# Patient Record
Sex: Male | Born: 1955 | Race: Black or African American | Hispanic: No | Marital: Married | Smoking: Never smoker
Health system: Southern US, Community
[De-identification: ages and names within clinical notes are randomized; demographics above are authoritative.]

## PROBLEM LIST (undated history)

## (undated) DIAGNOSIS — N186 End stage renal disease: Secondary | ICD-10-CM

## (undated) DIAGNOSIS — D649 Anemia, unspecified: Secondary | ICD-10-CM

## (undated) DIAGNOSIS — T8859XA Other complications of anesthesia, initial encounter: Secondary | ICD-10-CM

## (undated) DIAGNOSIS — E119 Type 2 diabetes mellitus without complications: Secondary | ICD-10-CM

## (undated) DIAGNOSIS — N189 Chronic kidney disease, unspecified: Secondary | ICD-10-CM

## (undated) DIAGNOSIS — I10 Essential (primary) hypertension: Secondary | ICD-10-CM

## (undated) DIAGNOSIS — M1990 Unspecified osteoarthritis, unspecified site: Secondary | ICD-10-CM

## (undated) DIAGNOSIS — G4730 Sleep apnea, unspecified: Secondary | ICD-10-CM

## (undated) HISTORY — PX: ROTATOR CUFF REPAIR: SHX139

## (undated) HISTORY — PX: HERNIA REPAIR: SHX51

---

## 2020-10-18 ENCOUNTER — Other Ambulatory Visit: Payer: Self-pay

## 2020-10-18 ENCOUNTER — Ambulatory Visit (HOSPITAL_COMMUNITY)
Admission: EM | Admit: 2020-10-18 | Discharge: 2020-10-18 | Disposition: A | Discharge status: Home / Self Care | Payer: No Typology Code available for payment source | Attending: Physician Assistant | Admitting: Physician Assistant

## 2020-10-18 ENCOUNTER — Encounter (HOSPITAL_COMMUNITY): Payer: Self-pay | Admitting: Emergency Medicine

## 2020-10-18 DIAGNOSIS — J329 Chronic sinusitis, unspecified: Secondary | ICD-10-CM | POA: Insufficient documentation

## 2020-10-18 DIAGNOSIS — J9801 Acute bronchospasm: Secondary | ICD-10-CM | POA: Insufficient documentation

## 2020-10-18 DIAGNOSIS — J40 Bronchitis, not specified as acute or chronic: Secondary | ICD-10-CM | POA: Insufficient documentation

## 2020-10-18 HISTORY — DX: Type 2 diabetes mellitus without complications: E11.9

## 2020-10-18 HISTORY — DX: Essential (primary) hypertension: I10

## 2020-10-18 LAB — CBC WITH DIFFERENTIAL/PLATELET
Abs Immature Granulocytes: 0.1 10*3/uL — ABNORMAL HIGH (ref 0.00–0.07)
Basophils Absolute: 0 10*3/uL (ref 0.0–0.1)
Basophils Relative: 1 %
Eosinophils Absolute: 0.4 10*3/uL (ref 0.0–0.5)
Eosinophils Relative: 5 %
HCT: 32.2 % — ABNORMAL LOW (ref 39.0–52.0)
Hemoglobin: 10.1 g/dL — ABNORMAL LOW (ref 13.0–17.0)
Immature Granulocytes: 2 %
Lymphocytes Relative: 15 %
Lymphs Abs: 1 10*3/uL (ref 0.7–4.0)
MCH: 29.1 pg (ref 26.0–34.0)
MCHC: 31.4 g/dL (ref 30.0–36.0)
MCV: 92.8 fL (ref 80.0–100.0)
Monocytes Absolute: 0.8 10*3/uL (ref 0.1–1.0)
Monocytes Relative: 11 %
Neutro Abs: 4.5 10*3/uL (ref 1.7–7.7)
Neutrophils Relative %: 66 %
Platelets: 244 10*3/uL (ref 150–400)
RBC: 3.47 MIL/uL — ABNORMAL LOW (ref 4.22–5.81)
RDW: 14.8 % (ref 11.5–15.5)
WBC: 6.8 10*3/uL (ref 4.0–10.5)
nRBC: 0 % (ref 0.0–0.2)

## 2020-10-18 LAB — CBG MONITORING, ED: Glucose-Capillary: 384 mg/dL — ABNORMAL HIGH (ref 70–99)

## 2020-10-18 LAB — COMPREHENSIVE METABOLIC PANEL
ALT: 11 U/L (ref 0–44)
AST: 12 U/L — ABNORMAL LOW (ref 15–41)
Albumin: 2.9 g/dL — ABNORMAL LOW (ref 3.5–5.0)
Alkaline Phosphatase: 78 U/L (ref 38–126)
Anion gap: 6 (ref 5–15)
BUN: 48 mg/dL — ABNORMAL HIGH (ref 8–23)
CO2: 21 mmol/L — ABNORMAL LOW (ref 22–32)
Calcium: 8.3 mg/dL — ABNORMAL LOW (ref 8.9–10.3)
Chloride: 107 mmol/L (ref 98–111)
Creatinine, Ser: 7.31 mg/dL — ABNORMAL HIGH (ref 0.61–1.24)
GFR, Estimated: 8 mL/min — ABNORMAL LOW (ref >60)
Glucose, Bld: 449 mg/dL — ABNORMAL HIGH (ref 70–99)
Potassium: 5.3 mmol/L — ABNORMAL HIGH (ref 3.5–5.1)
Sodium: 134 mmol/L — ABNORMAL LOW (ref 135–145)
Total Bilirubin: 0.5 mg/dL (ref 0.3–1.2)
Total Protein: 7 g/dL (ref 6.5–8.1)

## 2020-10-18 LAB — BRAIN NATRIURETIC PEPTIDE: B Natriuretic Peptide: 94.8 pg/mL (ref 0.0–100.0)

## 2020-10-18 MED ORDER — BUDESONIDE-FORMOTEROL FUMARATE 160-4.5 MCG/ACT IN AERO: 2.0000 | INHALATION_SPRAY | Freq: Two times a day (BID) | RESPIRATORY_TRACT | 0 refills | Status: DC

## 2020-10-18 MED ORDER — ALBUTEROL SULFATE HFA 108 (90 BASE) MCG/ACT IN AERS
2.0000 | INHALATION_SPRAY | Freq: Once | RESPIRATORY_TRACT | Status: AC
  Administered 2020-10-18: 2 via RESPIRATORY_TRACT

## 2020-10-18 MED ORDER — ALBUTEROL SULFATE HFA 108 (90 BASE) MCG/ACT IN AERS
INHALATION_SPRAY | RESPIRATORY_TRACT | Status: AC
  Filled 2020-10-18: qty 6.7

## 2020-10-18 MED ORDER — DOXYCYCLINE HYCLATE 100 MG PO CAPS: 100.0000 mg | ORAL_CAPSULE | Freq: Two times a day (BID) | ORAL | 0 refills | Status: DC

## 2020-10-18 MED ORDER — ALBUTEROL SULFATE HFA 108 (90 BASE) MCG/ACT IN AERS: 1.0000 | INHALATION_SPRAY | Freq: Four times a day (QID) | RESPIRATORY_TRACT | 0 refills | Status: DC | PRN

## 2020-10-18 MED ORDER — PREDNISONE 10 MG PO TABS: 20.0000 mg | ORAL_TABLET | Freq: Every day | ORAL | 0 refills | Status: DC

## 2020-10-18 NOTE — ED Provider Notes (Signed)
[facility]    CSN: [id number] Arrival date & time: 10/18/20  1855      History   Chief Complaint Chief Complaint  [name] presents with  . Cough  . Nasal Congestion    HPI Marcus [name] is a 65 y.o. male.   [name] presents today with a 1 week history of cough.  Reports associated shortness of breath, wheezing, nasal congestion.  He denies any fever, chest pain, nausea, vomiting.  He has tried over-the-counter cough medication without improvement of symptoms.  He denies any known sick contacts.  He denies any recent antibiotic use.  He denies history of asthma, COPD, smoking.  He denies history of cardiac disease including heart failure.  He does report difficulty laying flat but attributes this to nasal congestion.  Denies any significant leg swelling or sudden weight gain.  He has had COVID-19 vaccinations including boosters.  Has not had influenza vaccination.  He is having difficulty with daily activities as a result of symptoms.  He has a history of diabetes and so is unable to take large amounts of prednisone.  Reports blood sugars are generally controlled with oral agents.     Past Medical History:  Diagnosis Date  . Diabetes mellitus without complication ([place])   . Hypertension     There are no problems to display for this [name].   Past Surgical History:  Procedure Laterality Date  . HERNIA REPAIR    . ROTATOR CUFF REPAIR Right        Home Medications    Prior to Admission medications   Medication Sig Start Date End Date Taking? Authorizing Provider  amLODipine (NORVASC) 10 MG tablet Take by mouth. 03/22/20  Yes [provider]  atorvastatin (LIPITOR) 20 MG tablet Take 0.5 tablets by mouth at bedtime. 06/29/20  Yes [provider]  carvedilol (COREG) 25 MG tablet Take by mouth. 03/22/20  Yes [provider]  losartan (COZAAR) 100 MG tablet Take 1 tablet by mouth daily. 07/16/20  Yes [provider]  albuterol  (VENTOLIN HFA) 108 (90 Base) MCG/ACT inhaler Inhale 1-2 puffs into the lungs every 6 (six) hours as needed for wheezing or shortness of breath. 10/18/20   Marcus [name], Marcus Skill, PA-C  B Complex Vitamins (B COMPLEX-B12) TABS Take 1 tablet by mouth daily.    [provider]  budesonide-formoterol (SYMBICORT) 160-4.5 MCG/ACT inhaler Inhale 2 puffs into the lungs in the morning and at bedtime. 10/18/20   Marcus [name], Marcus Skill, PA-C  doxycycline (VIBRAMYCIN) 100 MG capsule Take 1 capsule (100 mg total) by mouth 2 (two) times daily. 10/18/20   Marcus Gauthier K, PA-C  glipiZIDE (GLUCOTROL) 5 MG tablet Take by mouth.    [provider]  hydrochlorothiazide (HYDRODIURIL) 12.5 MG tablet Take by mouth.    [provider]  potassium chloride (KLOR-CON) 10 MEQ tablet Take by mouth.    [provider]    Family History History reviewed. No pertinent family history.  Social History Social History   Tobacco Use  . Smoking status: Never Smoker  . Smokeless tobacco: Never Used  Vaping Use  . Vaping Use: Never used  Substance Use Topics  . Alcohol use: Never  . Drug use: Never     Allergies   [name] has no known allergies.   Review of Systems Review of Systems  Constitutional: Positive for activity change and appetite change. Negative for fatigue, fever and unexpected weight change.  HENT: Positive for sinus pressure. Negative for congestion, sneezing  and sore throat.   Respiratory: Positive for cough, chest tightness, shortness of breath and wheezing.   Cardiovascular: Negative for chest pain.  Gastrointestinal: Negative for abdominal pain, diarrhea, nausea and vomiting.  Musculoskeletal: Negative for arthralgias and myalgias.  Neurological: Positive for dizziness. Negative for headaches.     Physical Exam Triage Vital Signs ED Triage Vitals  Enc Vitals Group     BP 10/18/20 2020 113/73     Pulse Rate 10/18/20 2020 67     Resp 10/18/20 2020 (!) 25     Temp 10/18/20  2020 98.1 F (36.7 C)     Temp Source 10/18/20 2020 Oral     SpO2 10/18/20 2020 98 %     Weight --      Height --      Head Circumference --      Peak Flow --      Pain Score 10/18/20 2015 0     Pain Loc --      Pain Edu? --      Excl. in [place]? --    No data found.  Updated Vital Signs BP 113/73 (BP Location: Left Arm) Comment (BP Location): large cuff  Pulse 67   Temp 98.1 F (36.7 C) (Oral)   Resp (!) 25   SpO2 98%   Visual Acuity Right Eye Distance:   Left Eye Distance:   Bilateral Distance:    Right Eye Near:   Left Eye Near:    Bilateral Near:     Physical Exam Vitals reviewed.  Constitutional:      General: He is awake.     Appearance: Normal appearance. He is normal weight. He is not ill-appearing.     Comments: Very pleasant male appears stated age in no acute distress  HENT:     Head: Normocephalic and atraumatic.     Right Ear: Tympanic membrane, ear canal and external ear normal. Tympanic membrane is not erythematous or bulging.     Left Ear: Tympanic membrane, ear canal and external ear normal. Tympanic membrane is not erythematous or bulging.     Nose: Nose normal.     Right Sinus: No maxillary sinus tenderness or frontal sinus tenderness.     Left Sinus: No maxillary sinus tenderness or frontal sinus tenderness.     Mouth/Throat:     Pharynx: Uvula midline. Posterior oropharyngeal erythema present. No oropharyngeal exudate.  Cardiovascular:     Rate and Rhythm: Normal rate and regular rhythm.     Heart sounds: No murmur heard.   Pulmonary:     Effort: Pulmonary effort is normal. No accessory muscle usage or respiratory distress.     Breath sounds: No stridor. Wheezing present. No rhonchi or rales.     Comments: Widespread wheezing throughout lung fields.  Reactive cough with deep breathing. Abdominal:     General: Bowel sounds are normal.     Palpations: Abdomen is soft.     Tenderness: There is no abdominal tenderness.     Comments: Benign  abdominal exam  Musculoskeletal:     Right lower leg: No edema.     Left lower leg: No edema.  Lymphadenopathy:     Head:     Right side of head: No submental, submandibular or tonsillar adenopathy.     Left side of head: No submental, submandibular or tonsillar adenopathy.     Cervical: No cervical adenopathy.  Neurological:     Mental Status: He is alert.  Psychiatric:  Behavior: Behavior is cooperative.      UC Treatments / Results  Labs (all labs ordered are listed, but only abnormal results are displayed) Labs Reviewed  CBG MONITORING, ED - Abnormal; Notable for the following components:      Result Value   Glucose-Capillary 384 (*)    All other components within normal limits  CBC WITH DIFFERENTIAL/PLATELET  COMPREHENSIVE METABOLIC PANEL  BRAIN NATRIURETIC PEPTIDE    EKG   Radiology No results found.  Procedures Procedures (including critical care time)  Medications Ordered in UC Medications  albuterol (VENTOLIN HFA) 108 (90 Base) MCG/ACT inhaler 2 puff (2 puffs Inhalation Given 10/18/20 2040)    Initial Impression / Assessment and Plan / UC Course  I have reviewed the triage vital signs and the nursing notes.  Pertinent labs & imaging results that were available during my care of the [name] were reviewed by me and considered in my medical decision making (see chart for details).     Suspect symptoms are related to significant bronchospasm.  [name] given albuterol inhaler in office with minimal improvement of symptoms.  He was prescribed albuterol to be used at home.  Will prescribe Symbicort to help manage symptoms.  He is diabetic but random blood sugar was 384 so we will defer steroids at this time.  [name] was instructed to avoid carbohydrates and push fluids.  Discussed that if blood sugar is elevated he needs to contact PCP.  CBC, BNP, CMP obtained today-results pending.  Low suspicion for CHF given no edema on exam lungs are clear but will  obtain BNP to rule out CHF exacerbation; if elevated [name] will need to go to the ER.  If [name] has significant leukocytosis on CBC he will also need to go to the emergency room.  [name] was instructed to follow-up with either our clinic or PCP within 24 to 48 hours to ensure improvement of symptoms.  Discussed at length alarm symptoms that warrant emergent evaluation.  Strict return precautions given to which [name] expressed understanding.  Final Clinical Impressions(s) / UC Diagnoses   Final diagnoses:  Sinobronchitis  Bronchospasm     Discharge Instructions     Take albuterol every 4-6 hours as needed for shortness of breath.  Take Symbicort twice daily to help open up your lungs.  You should rinse your mouth after using this medication to avoid thrush. Take doxycycline (antibiotic) twice daily for 10 days.  This will make you more sensitive to the sun so please do not be in the sun while on this medicine.  If you have any worsening symptoms including persistent shortness of breath you need to be reevaluated in the emergency room.  Someone to listen to you within 24 to 48 hours to ensure improvement which can be done either with your PCP or our clinic.    ED Prescriptions    Medication Sig Dispense Auth. Provider   albuterol (VENTOLIN HFA) 108 (90 Base) MCG/ACT inhaler  (Status: Discontinued) Inhale 1-2 puffs into the lungs every 6 (six) hours as needed for wheezing or shortness of breath. 8 g Vallery Mcdade K, PA-C   budesonide-formoterol (SYMBICORT) 160-4.5 MCG/ACT inhaler  (Status: Discontinued) Inhale 2 puffs into the lungs in the morning and at bedtime. 10.2 g Marcus [name] K, PA-C   predniSONE (DELTASONE) 10 MG tablet  (Status: Discontinued) Take 2 tablets (20 mg total) by mouth daily for 4 days. 8 tablet Marcus [name] K, PA-C   doxycycline (VIBRAMYCIN) 100 MG capsule  (Status: Discontinued)  Take 1 capsule (100 mg total) by mouth 2 (two) times daily. 20 capsule Marcus [name] K, PA-C    albuterol (VENTOLIN HFA) 108 (90 Base) MCG/ACT inhaler Inhale 1-2 puffs into the lungs every 6 (six) hours as needed for wheezing or shortness of breath. 8 g Marcus [name] K, PA-C   budesonide-formoterol (SYMBICORT) 160-4.5 MCG/ACT inhaler Inhale 2 puffs into the lungs in the morning and at bedtime. 10.2 g Marcus [name] K, PA-C   doxycycline (VIBRAMYCIN) 100 MG capsule Take 1 capsule (100 mg total) by mouth 2 (two) times daily. 20 capsule Hoyt Leanos, Marcus Skill, PA-C     PDMP not reviewed this encounter.   Terrilee Croak, PA-C 10/18/20 2107

## 2020-10-18 NOTE — ED Triage Notes (Addendum)
Cough and congestion for a week, patient complains of wheezing as well  Unable to lie down and breathe.  After lying down, will sit up for about 5 minutes and then be able to breathe

## 2020-10-18 NOTE — Discharge Instructions (Addendum)
Take albuterol every 4-6 hours as needed for shortness of breath.  Take Symbicort twice daily to help open up your lungs.  You should rinse your mouth after using this medication to avoid thrush. Take doxycycline (antibiotic) twice daily for 10 days.  This will make you more sensitive to the sun so please do not be in the sun while on this medicine.  If you have any worsening symptoms including persistent shortness of breath you need to be reevaluated in the emergency room.  Someone to listen to you within 24 to 48 hours to ensure improvement which can be done either with your PCP or our clinic.

## 2021-01-10 ENCOUNTER — Emergency Department (HOSPITAL_COMMUNITY)
Admission: EM | Admit: 2021-01-10 | Discharge: 2021-01-10 | Disposition: A | Discharge status: Home / Self Care | Payer: No Typology Code available for payment source | Attending: Emergency Medicine | Admitting: Emergency Medicine

## 2021-01-10 ENCOUNTER — Other Ambulatory Visit: Payer: Self-pay

## 2021-01-10 ENCOUNTER — Encounter (HOSPITAL_COMMUNITY): Payer: Self-pay | Admitting: *Deleted

## 2021-01-10 DIAGNOSIS — E1165 Type 2 diabetes mellitus with hyperglycemia: Secondary | ICD-10-CM | POA: Diagnosis not present

## 2021-01-10 DIAGNOSIS — R739 Hyperglycemia, unspecified: Secondary | ICD-10-CM

## 2021-01-10 DIAGNOSIS — Z5329 Procedure and treatment not carried out because of patient's decision for other reasons: Secondary | ICD-10-CM

## 2021-01-10 DIAGNOSIS — I10 Essential (primary) hypertension: Secondary | ICD-10-CM | POA: Insufficient documentation

## 2021-01-10 DIAGNOSIS — Z79899 Other long term (current) drug therapy: Secondary | ICD-10-CM | POA: Diagnosis not present

## 2021-01-10 DIAGNOSIS — Z7984 Long term (current) use of oral hypoglycemic drugs: Secondary | ICD-10-CM | POA: Diagnosis not present

## 2021-01-10 DIAGNOSIS — Z7951 Long term (current) use of inhaled steroids: Secondary | ICD-10-CM | POA: Diagnosis not present

## 2021-01-10 DIAGNOSIS — N289 Disorder of kidney and ureter, unspecified: Secondary | ICD-10-CM

## 2021-01-10 LAB — CBG MONITORING, ED
Glucose-Capillary: 511 mg/dL (ref 70–99)
Glucose-Capillary: 498 mg/dL — ABNORMAL HIGH (ref 70–99)
Glucose-Capillary: 446 mg/dL — ABNORMAL HIGH (ref 70–99)

## 2021-01-10 LAB — URINALYSIS, ROUTINE W REFLEX MICROSCOPIC
Bacteria, UA: NONE SEEN
Bilirubin Urine: NEGATIVE
Glucose, UA: >=500 mg/dL — AB
Ketones, ur: NEGATIVE mg/dL
Leukocytes,Ua: NEGATIVE
Nitrite: NEGATIVE
Protein, ur: >=300 mg/dL — AB
Specific Gravity, Urine: 1.012 (ref 1.005–1.030)
pH: 5 (ref 5.0–8.0)

## 2021-01-10 LAB — CBC
HCT: 30.9 % — ABNORMAL LOW (ref 39.0–52.0)
Hemoglobin: 10.2 g/dL — ABNORMAL LOW (ref 13.0–17.0)
MCH: 29.3 pg (ref 26.0–34.0)
MCHC: 33 g/dL (ref 30.0–36.0)
MCV: 88.8 fL (ref 80.0–100.0)
Platelets: 239 10*3/uL (ref 150–400)
RBC: 3.48 MIL/uL — ABNORMAL LOW (ref 4.22–5.81)
RDW: 15.3 % (ref 11.5–15.5)
WBC: 6 10*3/uL (ref 4.0–10.5)
nRBC: 0 % (ref 0.0–0.2)

## 2021-01-10 LAB — BASIC METABOLIC PANEL
Anion gap: 9 (ref 5–15)
BUN: 98 mg/dL — ABNORMAL HIGH (ref 8–23)
CO2: 21 mmol/L — ABNORMAL LOW (ref 22–32)
Calcium: 8.9 mg/dL (ref 8.9–10.3)
Chloride: 101 mmol/L (ref 98–111)
Creatinine, Ser: 8.76 mg/dL — ABNORMAL HIGH (ref 0.61–1.24)
GFR, Estimated: 6 mL/min — ABNORMAL LOW (ref >60)
Glucose, Bld: 630 mg/dL (ref 70–99)
Potassium: 4.9 mmol/L (ref 3.5–5.1)
Sodium: 131 mmol/L — ABNORMAL LOW (ref 135–145)

## 2021-01-10 LAB — BLOOD GAS, VENOUS
Acid-base deficit: 6.3 mmol/L — ABNORMAL HIGH (ref 0.0–2.0)
Bicarbonate: 20.3 mmol/L (ref 20.0–28.0)
O2 Saturation: 72.6 %
Patient temperature: 37
pCO2, Ven: 48.1 mmHg (ref 44.0–60.0)
pH, Ven: 7.249 — ABNORMAL LOW (ref 7.250–7.430)
pO2, Ven: 45.8 mmHg — ABNORMAL HIGH (ref 32.0–45.0)

## 2021-01-10 MED ORDER — INSULIN ASPART 100 UNIT/ML IV SOLN
10.0000 [IU] | Freq: Once | INTRAVENOUS | Status: AC
  Administered 2021-01-10: 10 [IU] via INTRAVENOUS
  Filled 2021-01-10: qty 0.1

## 2021-01-10 MED ORDER — LACTATED RINGERS IV BOLUS
1000.0000 mL | Freq: Once | INTRAVENOUS | Status: AC
  Administered 2021-01-10: 1000 mL via INTRAVENOUS

## 2021-01-10 NOTE — ED Triage Notes (Signed)
Pt went to PCP for regular checkup. CBG 609. Sent here for treatment.

## 2021-01-10 NOTE — Discharge Instructions (Addendum)
Please continue to monitor your symptoms closely.  I would recommend coming back to the emergency department for nephrology evaluation as well as admission.  Please make sure you also follow-up with your regular doctor regarding your kidney function today as well as your high blood sugar.

## 2021-01-10 NOTE — ED Notes (Signed)
Pt was able to walk with a steady gait to restroom and back to room.

## 2021-01-10 NOTE — ED Provider Notes (Signed)
[facility] DEPT Provider Note   CSN: [id number] Arrival date & time: 01/10/21  1452     History Chief Complaint  Patient presents with   Hyperglycemia    Marcus [name] is a 65 y.o. male.  HPI Patient is a 65 year old male with a history of hypertension as well as diabetes mellitus on glipizide twice daily who states that he recently moved to the area from [place].  He was establishing care with a new PCP at his local [place].  After obtaining labs and going home they called him and told him that his CBG was elevated at 609 and told him to come to the emergency department for treatment.  Patient states that for the past few days she has been increasingly fatigued and reports polyuria as well as polydipsia.  Also notes some intermittent blurry vision.  No abdominal pain, nausea, vomiting, or diarrhea.  His wife states that he has been taking his glipizide but recently has also been drinking more soda than normal and eating potatoes and cheese.    Past Medical History:  Diagnosis Date   Diabetes mellitus without complication ([place])    Hypertension     There are no problems to display for this patient.   Past Surgical History:  Procedure Laterality Date   HERNIA REPAIR     ROTATOR CUFF REPAIR Right        No family history on file.  Social History   Tobacco Use   Smoking status: Never   Smokeless tobacco: Never  Vaping Use   Vaping Use: Never used  Substance Use Topics   Alcohol use: Never   Drug use: Never    Home Medications Prior to Admission medications   Medication Sig Start Date End Date Taking? Authorizing Provider  albuterol (VENTOLIN HFA) 108 (90 Base) MCG/ACT inhaler Inhale 1-2 puffs into the lungs every 6 (six) hours as needed for wheezing or shortness of breath. 10/18/20   Raspet, Derry Skill, PA-C  amLODipine (NORVASC) 10 MG tablet Take by mouth. 03/22/20   [provider]  atorvastatin (LIPITOR) 20 MG  tablet Take 0.5 tablets by mouth at bedtime. 06/29/20   [provider]  B Complex Vitamins (B COMPLEX-B12) TABS Take 1 tablet by mouth daily.    [provider]  budesonide-formoterol (SYMBICORT) 160-4.5 MCG/ACT inhaler Inhale 2 puffs into the lungs in the morning and at bedtime. 10/18/20   Raspet, Derry Skill, PA-C  carvedilol (COREG) 25 MG tablet Take by mouth. 03/22/20   [provider]  doxycycline (VIBRAMYCIN) 100 MG capsule Take 1 capsule (100 mg total) by mouth 2 (two) times daily. 10/18/20   Raspet, Erin K, PA-C  glipiZIDE (GLUCOTROL) 5 MG tablet Take by mouth.    [provider]  hydrochlorothiazide (HYDRODIURIL) 12.5 MG tablet Take by mouth.    [provider]  losartan (COZAAR) 100 MG tablet Take 1 tablet by mouth daily. 07/16/20   [provider]  potassium chloride (KLOR-CON) 10 MEQ tablet Take by mouth.    [provider]    Allergies    Amlodipine  Review of Systems   Review of Systems  All other systems reviewed and are negative. Ten systems reviewed and are negative for acute change, except as noted in the HPI.   Physical Exam Updated Vital Signs BP (!) 183/87   Pulse 74   Temp 97.9 F (36.6 C) (Oral)   Resp 16   Ht '5\' 7"'$  (1.702 m)  Wt 124.3 kg   SpO2 97%   BMI 42.91 kg/m   Physical Exam Vitals and nursing note reviewed.  Constitutional:      General: He is not in acute distress.    Appearance: Normal appearance. He is not ill-appearing, toxic-appearing or diaphoretic.  HENT:     Head: Normocephalic and atraumatic.     Right Ear: External ear normal.     Left Ear: External ear normal.     Nose: Nose normal.     Mouth/Throat:     Mouth: Mucous membranes are moist.     Pharynx: Oropharynx is clear. No oropharyngeal exudate or posterior oropharyngeal erythema.  Eyes:     Extraocular Movements: Extraocular movements intact.  Cardiovascular:     Rate and Rhythm: Normal rate and regular rhythm.      Pulses: Normal pulses.     Heart sounds: Normal heart sounds. No murmur heard.   No friction rub. No gallop.     Comments: RRR without M/R/G. Pulmonary:     Effort: Pulmonary effort is normal. No respiratory distress.     Breath sounds: Normal breath sounds. No stridor. No wheezing, rhonchi or rales.     Comments: LCTAB. Abdominal:     General: Abdomen is flat.     Palpations: Abdomen is soft.     Tenderness: There is no abdominal tenderness.     Comments: Protuberant abdomen that is soft and nontender.  Musculoskeletal:        General: Normal range of motion.     Cervical back: Normal range of motion and neck supple. No tenderness.  Skin:    General: Skin is warm and dry.  Neurological:     General: No focal deficit present.     Mental Status: He is alert and oriented to person, place, and time.  Psychiatric:        Mood and Affect: Mood normal.        Behavior: Behavior normal.   ED Results / Procedures / Treatments   Labs (all labs ordered are listed, but only abnormal results are displayed) Labs Reviewed  BASIC METABOLIC PANEL - Abnormal; Notable for the following components:      Result Value   Sodium 131 (*)    CO2 21 (*)    Glucose, Bld 630 (*)    BUN 98 (*)    Creatinine, Ser 8.76 (*)    GFR, Estimated 6 (*)    All other components within normal limits  CBC - Abnormal; Notable for the following components:   RBC 3.48 (*)    Hemoglobin 10.2 (*)    HCT 30.9 (*)    All other components within normal limits  URINALYSIS, ROUTINE W REFLEX MICROSCOPIC - Abnormal; Notable for the following components:   Color, Urine STRAW (*)    Glucose, UA >=500 (*)    Hgb urine dipstick MODERATE (*)    Protein, ur >=300 (*)    All other components within normal limits  BLOOD GAS, VENOUS - Abnormal; Notable for the following components:   pH, Ven 7.249 (*)    pO2, Ven 45.8 (*)    Acid-base deficit 6.3 (*)    All other components within normal limits  CBG MONITORING, ED -  Abnormal; Notable for the following components:   Glucose-Capillary 511 (*)    All other components within normal limits  CBG MONITORING, ED - Abnormal; Notable for the following components:   Glucose-Capillary 498 (*)    All other components within  normal limits  CBG MONITORING, ED - Abnormal; Notable for the following components:   Glucose-Capillary 446 (*)    All other components within normal limits  HEPATIC FUNCTION PANEL  CBG MONITORING, ED   EKG None  Radiology No results found.  Procedures Procedures   Medications Ordered in ED Medications  lactated ringers bolus 1,000 mL (0 mLs Intravenous Stopped 01/10/21 1639)  insulin aspart (novoLOG) injection 10 Units (10 Units Intravenous Given 01/10/21 1725)    ED Course  I have reviewed the triage vital signs and the nursing notes.  Pertinent labs & imaging results that were available during my care of the patient were reviewed by me and considered in my medical decision making (see chart for details).  Clinical Course as of 01/10/21 1843  Thu Jan 10, 2021  1717 Lab work resulted showing a significantly elevated glucose at 630.  Uremic at 98 with a creatinine of 8.76 and a GFR of 6.  No anion gap.  Mild hyponatremia that is likely pseudohyponatremia due to patient's elevated glucose.  Discussed these findings with the patient and his wife in length.  States that he was working with the [facility] to get set up with a nephrologist but this was never done.  Since moving to [place] he states that he has never been evaluated by nephrology. [LJ]  1803 Patient discussed with Dr. Joelyn Oms with nephrology.  He recommends admission and transfer to [facility] for dialysis.   [LJ]    Clinical Course User Index [LJ] Rayna Sexton, PA-C   MDM Rules/Calculators/A&P                          Pt is a 65 y.o. male who presents to the emergency department due to hyperglycemia.  Labs: CBC with a hemoglobin of 10.3. UA with glucosuria greater than  500, moderate hemoglobin, greater than 300 protein. VBG with a pH of 7.249. BMP with a sodium of 131, CO2 21, glucose of 630, BUN of 98, creatinine of 8.76, GFR of 6. CBG of 511 with a repeat of 498 after 1 liter of IV fluids.  Third CBG is 446 after 10 units of insulin.  I, Rayna Sexton, PA-C, personally reviewed and evaluated these images and lab results as part of my medical decision-making.  Patient initially presented due to hyperglycemia after being evaluated at the [place] today.  History of diabetes mellitus and on glipizide twice daily.  States he has been compliant with his medication.  He recently moved to the area and had his first appointment at the new [place] office today.  He was given a liter of fluids as well as 10 units of IV insulin and his CBG is now at 446.  No anion gap or ketonuria.  Patient denies any systemic symptoms such as nausea, vomiting, diarrhea, abdominal pain.  Doubt DKA at this time.  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  Discussed with nephrology who recommended admission for dialysis.  This was discussed with the patient as well as his wife who is at bedside.  They would prefer to follow-up outpatient.  Offered to discuss once again with nephrology to set up an outpatient appointment and patient declined and states that he is going to follow-up with the [place].  Discussed the risks associated with being discharged at this time and both patient as well as his wife confirmed understanding.  Patient understands that there is a significant risk of death  due to his current kidney function as well as poorly managed diabetes mellitus.  He verbalized understanding this and confirms that he would like to leave [facility].  I had the nursing staff also discussed this with the patient and he confirmed for a third time that he wants to leave [facility].  Note: Portions of this report may have been transcribed using voice recognition software. Every  effort was made to ensure accuracy; however, inadvertent computerized transcription errors may be present.   Final Clinical Impression(s) / ED Diagnoses Final diagnoses:  Hyperglycemia  Renal insufficiency  Left against medical advice   Rx / DC Orders ED Discharge Orders     None        Rayna Sexton, PA-C 01/10/21 1846    Blanchie Dessert, MD 01/10/21 2322

## 2021-07-16 ENCOUNTER — Inpatient Hospital Stay (HOSPITAL_COMMUNITY)
Admission: EM | Admit: 2021-07-16 | Discharge: 2021-07-22 | DRG: 674 | Disposition: A | Discharge status: Home Health | Payer: No Typology Code available for payment source | Attending: Internal Medicine | Admitting: Internal Medicine

## 2021-07-16 ENCOUNTER — Emergency Department (HOSPITAL_COMMUNITY): Payer: No Typology Code available for payment source

## 2021-07-16 ENCOUNTER — Inpatient Hospital Stay (HOSPITAL_COMMUNITY): Payer: No Typology Code available for payment source

## 2021-07-16 ENCOUNTER — Encounter (HOSPITAL_COMMUNITY): Payer: Self-pay | Admitting: Emergency Medicine

## 2021-07-16 DIAGNOSIS — E1122 Type 2 diabetes mellitus with diabetic chronic kidney disease: Secondary | ICD-10-CM | POA: Diagnosis present

## 2021-07-16 DIAGNOSIS — N186 End stage renal disease: Secondary | ICD-10-CM | POA: Diagnosis present

## 2021-07-16 DIAGNOSIS — N2581 Secondary hyperparathyroidism of renal origin: Secondary | ICD-10-CM | POA: Diagnosis present

## 2021-07-16 DIAGNOSIS — N185 Chronic kidney disease, stage 5: Secondary | ICD-10-CM

## 2021-07-16 DIAGNOSIS — E785 Hyperlipidemia, unspecified: Secondary | ICD-10-CM | POA: Diagnosis present

## 2021-07-16 DIAGNOSIS — Z8249 Family history of ischemic heart disease and other diseases of the circulatory system: Secondary | ICD-10-CM | POA: Diagnosis not present

## 2021-07-16 DIAGNOSIS — G4733 Obstructive sleep apnea (adult) (pediatric): Secondary | ICD-10-CM | POA: Diagnosis present

## 2021-07-16 DIAGNOSIS — I10 Essential (primary) hypertension: Secondary | ICD-10-CM | POA: Diagnosis present

## 2021-07-16 DIAGNOSIS — R799 Abnormal finding of blood chemistry, unspecified: Secondary | ICD-10-CM

## 2021-07-16 DIAGNOSIS — Z6841 Body Mass Index (BMI) 40.0 and over, adult: Secondary | ICD-10-CM

## 2021-07-16 DIAGNOSIS — N179 Acute kidney failure, unspecified: Secondary | ICD-10-CM | POA: Diagnosis present

## 2021-07-16 DIAGNOSIS — E8770 Fluid overload, unspecified: Principal | ICD-10-CM

## 2021-07-16 DIAGNOSIS — F32A Depression, unspecified: Secondary | ICD-10-CM | POA: Diagnosis present

## 2021-07-16 DIAGNOSIS — E66813 Obesity, class 3: Secondary | ICD-10-CM | POA: Diagnosis present

## 2021-07-16 DIAGNOSIS — Z7951 Long term (current) use of inhaled steroids: Secondary | ICD-10-CM | POA: Diagnosis not present

## 2021-07-16 DIAGNOSIS — I517 Cardiomegaly: Secondary | ICD-10-CM | POA: Diagnosis present

## 2021-07-16 DIAGNOSIS — I120 Hypertensive chronic kidney disease with stage 5 chronic kidney disease or end stage renal disease: Secondary | ICD-10-CM | POA: Diagnosis present

## 2021-07-16 DIAGNOSIS — N289 Disorder of kidney and ureter, unspecified: Secondary | ICD-10-CM | POA: Diagnosis not present

## 2021-07-16 DIAGNOSIS — Z841 Family history of disorders of kidney and ureter: Secondary | ICD-10-CM | POA: Diagnosis not present

## 2021-07-16 DIAGNOSIS — Z888 Allergy status to other drugs, medicaments and biological substances status: Secondary | ICD-10-CM

## 2021-07-16 DIAGNOSIS — E875 Hyperkalemia: Secondary | ICD-10-CM | POA: Diagnosis present

## 2021-07-16 DIAGNOSIS — E876 Hypokalemia: Secondary | ICD-10-CM | POA: Diagnosis present

## 2021-07-16 DIAGNOSIS — E11649 Type 2 diabetes mellitus with hypoglycemia without coma: Secondary | ICD-10-CM | POA: Diagnosis present

## 2021-07-16 DIAGNOSIS — Z91199 Patient's noncompliance with other medical treatment and regimen due to unspecified reason: Secondary | ICD-10-CM | POA: Diagnosis not present

## 2021-07-16 DIAGNOSIS — D631 Anemia in chronic kidney disease: Secondary | ICD-10-CM | POA: Diagnosis present

## 2021-07-16 DIAGNOSIS — Z992 Dependence on renal dialysis: Secondary | ICD-10-CM

## 2021-07-16 DIAGNOSIS — Z79899 Other long term (current) drug therapy: Secondary | ICD-10-CM

## 2021-07-16 DIAGNOSIS — E872 Acidosis, unspecified: Secondary | ICD-10-CM | POA: Diagnosis present

## 2021-07-16 DIAGNOSIS — Z20822 Contact with and (suspected) exposure to covid-19: Secondary | ICD-10-CM | POA: Diagnosis present

## 2021-07-16 DIAGNOSIS — Z4901 Encounter for fitting and adjustment of extracorporeal dialysis catheter: Secondary | ICD-10-CM | POA: Diagnosis not present

## 2021-07-16 DIAGNOSIS — D649 Anemia, unspecified: Secondary | ICD-10-CM | POA: Diagnosis present

## 2021-07-16 DIAGNOSIS — Z7984 Long term (current) use of oral hypoglycemic drugs: Secondary | ICD-10-CM

## 2021-07-16 LAB — CBC WITH DIFFERENTIAL/PLATELET
Abs Immature Granulocytes: 0.03 10*3/uL (ref 0.00–0.07)
Basophils Absolute: 0 10*3/uL (ref 0.0–0.1)
Basophils Relative: 1 %
Eosinophils Absolute: 0.5 10*3/uL (ref 0.0–0.5)
Eosinophils Relative: 8 %
HCT: 24.4 % — ABNORMAL LOW (ref 39.0–52.0)
Hemoglobin: 7.9 g/dL — ABNORMAL LOW (ref 13.0–17.0)
Immature Granulocytes: 1 %
Lymphocytes Relative: 15 %
Lymphs Abs: 0.9 10*3/uL (ref 0.7–4.0)
MCH: 31.5 pg (ref 26.0–34.0)
MCHC: 32.4 g/dL (ref 30.0–36.0)
MCV: 97.2 fL (ref 80.0–100.0)
Monocytes Absolute: 0.6 10*3/uL (ref 0.1–1.0)
Monocytes Relative: 10 %
Neutro Abs: 4.2 10*3/uL (ref 1.7–7.7)
Neutrophils Relative %: 65 %
Platelets: 188 10*3/uL (ref 150–400)
RBC: 2.51 MIL/uL — ABNORMAL LOW (ref 4.22–5.81)
RDW: 17.2 % — ABNORMAL HIGH (ref 11.5–15.5)
WBC: 6.3 10*3/uL (ref 4.0–10.5)
nRBC: 0 % (ref 0.0–0.2)

## 2021-07-16 LAB — I-STAT CHEM 8, ED
BUN: 129 mg/dL — ABNORMAL HIGH (ref 8–23)
BUN: >130 mg/dL — ABNORMAL HIGH (ref 8–23)
Calcium, Ion: 1 mmol/L — ABNORMAL LOW (ref 1.15–1.40)
Calcium, Ion: 1.01 mmol/L — ABNORMAL LOW (ref 1.15–1.40)
Chloride: 112 mmol/L — ABNORMAL HIGH (ref 98–111)
Chloride: 113 mmol/L — ABNORMAL HIGH (ref 98–111)
Creatinine, Ser: 17.3 mg/dL — ABNORMAL HIGH (ref 0.61–1.24)
Creatinine, Ser: >18 mg/dL — ABNORMAL HIGH (ref 0.61–1.24)
Glucose, Bld: 139 mg/dL — ABNORMAL HIGH (ref 70–99)
Glucose, Bld: 65 mg/dL — ABNORMAL LOW (ref 70–99)
HCT: 22 % — ABNORMAL LOW (ref 39.0–52.0)
HCT: 24 % — ABNORMAL LOW (ref 39.0–52.0)
Hemoglobin: 7.5 g/dL — ABNORMAL LOW (ref 13.0–17.0)
Hemoglobin: 8.2 g/dL — ABNORMAL LOW (ref 13.0–17.0)
Potassium: 4.9 mmol/L (ref 3.5–5.1)
Potassium: 5.5 mmol/L — ABNORMAL HIGH (ref 3.5–5.1)
Sodium: 143 mmol/L (ref 135–145)
Sodium: 144 mmol/L (ref 135–145)
TCO2: 18 mmol/L — ABNORMAL LOW (ref 22–32)
TCO2: 18 mmol/L — ABNORMAL LOW (ref 22–32)

## 2021-07-16 LAB — COMPREHENSIVE METABOLIC PANEL
ALT: 14 U/L (ref 0–44)
AST: 14 U/L — ABNORMAL LOW (ref 15–41)
Albumin: 3 g/dL — ABNORMAL LOW (ref 3.5–5.0)
Alkaline Phosphatase: 48 U/L (ref 38–126)
Anion gap: 12 (ref 5–15)
BUN: 115 mg/dL — ABNORMAL HIGH (ref 8–23)
CO2: 16 mmol/L — ABNORMAL LOW (ref 22–32)
Calcium: 7.4 mg/dL — ABNORMAL LOW (ref 8.9–10.3)
Chloride: 113 mmol/L — ABNORMAL HIGH (ref 98–111)
Creatinine, Ser: 16.58 mg/dL — ABNORMAL HIGH (ref 0.61–1.24)
GFR, Estimated: 3 mL/min — ABNORMAL LOW (ref >60)
Glucose, Bld: 70 mg/dL (ref 70–99)
Potassium: 5.5 mmol/L — ABNORMAL HIGH (ref 3.5–5.1)
Sodium: 141 mmol/L (ref 135–145)
Total Bilirubin: 0.3 mg/dL (ref 0.3–1.2)
Total Protein: 6.4 g/dL — ABNORMAL LOW (ref 6.5–8.1)

## 2021-07-16 LAB — RESP PANEL BY RT-PCR (FLU A&B, COVID) ARPGX2
Influenza A by PCR: NEGATIVE
Influenza B by PCR: NEGATIVE
SARS Coronavirus 2 by RT PCR: NEGATIVE

## 2021-07-16 LAB — BRAIN NATRIURETIC PEPTIDE: B Natriuretic Peptide: 344.1 pg/mL — ABNORMAL HIGH (ref 0.0–100.0)

## 2021-07-16 MED ORDER — FUROSEMIDE 10 MG/ML IJ SOLN
40.0000 mg | Freq: Once | INTRAMUSCULAR | Status: AC
  Administered 2021-07-16: 40 mg via INTRAVENOUS
  Filled 2021-07-16: qty 4

## 2021-07-16 MED ORDER — SODIUM ZIRCONIUM CYCLOSILICATE 10 G PO PACK
10.0000 g | PACK | Freq: Once | ORAL | Status: AC
  Administered 2021-07-16: 10 g via ORAL
  Filled 2021-07-16: qty 1

## 2021-07-16 MED ORDER — FUROSEMIDE 10 MG/ML IJ SOLN
80.0000 mg | Freq: Two times a day (BID) | INTRAMUSCULAR | Status: DC
  Administered 2021-07-17 – 2021-07-18 (×2): 80 mg via INTRAVENOUS
  Filled 2021-07-16: qty 8

## 2021-07-16 NOTE — Assessment & Plan Note (Signed)
Can restart Norvasc hold hydrochlorothiazide and Cozaar can also   restrt coreg if able

## 2021-07-16 NOTE — H&P (Signed)
Marcus [name] AOZ:[id number] DOB: February 14, 1956 DOA: 07/16/2021     PCP: System, Provider Not In  [place] Specialists:   NONE    Patient arrived to ER on 07/16/21 at 1218 Referred by Attending Regan Lemming, MD   Patient coming from:    home Lives  With family    Chief Complaint: Abnormal creatinine   HPI: Marcus [name] is a 66 y.o. male with medical history significant of chronic kidney disease stage V, hypertension hyperlipidemia diabetes type 2    Presented with was told his creatinine level was too high Brought in by [facility] from [facility] [place] when he was noted to have elevated creatinine up to 14.8 as well as 27 pound weight gain since November.  Patient not endorsing any complaints  He still making urine Noted orthopnea    Initial COVID TEST  NEGATIVE   Lab Results  Component Value Date   [place] NEGATIVE 07/16/2021     Regarding pertinent Chronic problems:    Hyperlipidemia -  on statins Lipitor     HTN on  Coreg HCTZ Cozaar      DM 2 - No results found for: HGBA1C on PO meds only,         Morbid obesity-   BMI Readings from Last 1 Encounters:  01/10/21 42.91 kg/m       Asthma -well  controlled on home inhalers/ nebs f                              OSA -on nocturnal  CPAP, noncompliant with CPAP     CKD stage V- baseline Cr 8.7 CrCl cannot be calculated (This lab value cannot be used to calculate CrCl because it is not a number: >18.00).  Lab Results  Component Value Date   CREATININE >18.00 (H) 07/16/2021   CREATININE 16.58 (H) 07/16/2021   CREATININE 8.76 (H) 01/10/2021      Chronic anemia - baseline hg Hemoglobin & Hematocrit  Recent Labs    01/10/21 1503 07/16/21 1303 07/16/21 1314  HGB 10.2* 7.9* 8.2*     While in ER:   Noted to have creatinine up to 16.6 with potassium up to 5.5 hemoglobin 7.9 down from baseline of 10.2 also hypoglycemic down to 65    Ordered  CXR  - cardiomegaly mild vascular congestion and small bilateral pleural effusions  Renal ultrasound - Slightly echogenic cortex consistent with medical renal disease. No hydronephrosis    Following Medications were ordered in ER: Medications  sodium zirconium cyclosilicate (LOKELMA) packet 10 g (has no administration in time range)  furosemide (LASIX) injection 40 mg (has no administration in time range)    _______________________________________________________ ER Provider Called:  nephrology   Dr.Bandari They Recommend admit to medicine  Will see in AM     ED Triage Vitals [07/16/21 1220]  Enc Vitals Group     BP (!) 199/104     Pulse Rate 75     Resp 18     Temp 99 F (37.2 C)     Temp Source Oral     SpO2 100 %     Weight      Height      Head Circumference      Peak Flow      Pain Score 0     Pain Loc      Pain Edu?  Excl. in [place]?   KVQQ(59)@     _________________________________________ Significant initial  Findings: Abnormal Labs Reviewed  BRAIN NATRIURETIC PEPTIDE - Abnormal; Notable for the following components:      Result Value   B Natriuretic Peptide 344.1 (*)    All other components within normal limits  COMPREHENSIVE METABOLIC PANEL - Abnormal; Notable for the following components:   Potassium 5.5 (*)    Chloride 113 (*)    CO2 16 (*)    BUN 115 (*)    Creatinine, Ser 16.58 (*)    Calcium 7.4 (*)    Total Protein 6.4 (*)    Albumin 3.0 (*)    AST 14 (*)    GFR, Estimated 3 (*)    All other components within normal limits  CBC WITH DIFFERENTIAL/PLATELET - Abnormal; Notable for the following components:   RBC 2.51 (*)    Hemoglobin 7.9 (*)    HCT 24.4 (*)    RDW 17.2 (*)    All other components within normal limits  RETICULOCYTES - Abnormal; Notable for the following components:   Retic Ct Pct 6.9 (*)    RBC. 2.29 (*)    All other components within normal limits  URINALYSIS, ROUTINE W REFLEX MICROSCOPIC - Abnormal; Notable for the following  components:   Color, Urine STRAW (*)    Glucose, UA 150 (*)    Hgb urine dipstick MODERATE (*)    Protein, ur >=300 (*)    All other components within normal limits  CBC WITH DIFFERENTIAL/PLATELET - Abnormal; Notable for the following components:   RBC 2.26 (*)    Hemoglobin 7.0 (*)    HCT 22.3 (*)    RDW 17.5 (*)    All other components within normal limits  I-STAT CHEM 8, ED - Abnormal; Notable for the following components:   Potassium 5.5 (*)    Chloride 113 (*)    BUN 129 (*)    Creatinine, Ser >18.00 (*)    Glucose, Bld 65 (*)    Calcium, Ion 1.01 (*)    TCO2 18 (*)    Hemoglobin 8.2 (*)    HCT 24.0 (*)    All other components within normal limits  I-STAT CHEM 8, ED - Abnormal; Notable for the following components:   Chloride 112 (*)    BUN >130 (*)    Creatinine, Ser 17.30 (*)    Glucose, Bld 139 (*)    Calcium, Ion 1.00 (*)    TCO2 18 (*)    Hemoglobin 7.5 (*)    HCT 22.0 (*)    All other components within normal limits  _________________________ Troponin  ordered ECG: Ordered Personally reviewed by me showing: HR : 78 Rhythm: NSR,   no evidence of ischemic changes QTC 466    The recent clinical data is shown below. Vitals:   07/16/21 1747 07/16/21 1946 07/16/21 2031 07/16/21 2145  BP: (!) 180/89 (!) 187/98 (!) 185/105 (!) 169/86  Pulse: 68 77 66 75  Resp: 18 16 19 16   Temp: 98.9 F (37.2 C) 98.3 F (36.8 C) 98.1 F (36.7 C)   TempSrc: Oral Oral Oral   SpO2: 100% 97% 99% 100%    WBC     Component Value Date/Time   WBC 6.3 07/16/2021 1303   LYMPHSABS 0.9 07/16/2021 1303   MONOABS 0.6 07/16/2021 1303   EOSABS 0.5 07/16/2021 1303   BASOSABS 0.0 07/16/2021 1303       UA  ordered    Results  for orders placed or performed during the hospital encounter of 07/16/21  Resp Panel by RT-PCR (Flu A&B, Covid) Nasopharyngeal Swab     Status: None   Collection Time: 07/16/21  9:41 PM   Specimen: Nasopharyngeal Swab; Nasopharyngeal(NP) swabs in vial  transport medium  Result Value Ref Range Status   SARS Coronavirus 2 by RT PCR NEGATIVE NEGATIVE Final         Influenza A by PCR NEGATIVE NEGATIVE Final   Influenza B by PCR NEGATIVE NEGATIVE Final           _______________________________________________ Hospitalist was called for admission for acute on chronic worsening renal function with evidence of fluid overload  The following Work up has been ordered so far:  Orders Placed This Encounter  Procedures   Resp Panel by RT-PCR (Flu A&B, Covid) Nasopharyngeal Swab   DG Chest 2 View   Brain natriuretic peptide   Comprehensive metabolic panel   CBC with Differential   Consult to nephrology   Consult for [facility] Admission   I-stat chem 8, ED (not at [facility] or [facility])   I-stat chem 8, ED (not at [facility] or [facility])   ED EKG   EKG 12-Lead   Saline lock IV     OTHER Significant initial  Findings:  labs showing:    Recent Labs  Lab 07/16/21 1303 07/16/21 1314 07/16/21 2347 07/16/21 2351  NA 141 144 142 143  K 5.5* 5.5* 4.9 4.9  CO2 16*  --  16*  --   GLUCOSE 70 65* 147* 139*  BUN 115* 129* 112* >130*  CREATININE 16.58* >18.00* 16.04* 17.30*  CALCIUM 7.4*  --  7.1*  --   MG  --   --  1.6*  --   PHOS  --   --  7.5*  --     Cr     Up from baseline see below Lab Results  Component Value Date   CREATININE >18.00 (H) 07/16/2021   CREATININE 16.58 (H) 07/16/2021   CREATININE 8.76 (H) 01/10/2021    Recent Labs  Lab 07/16/21 1303  AST 14*  ALT 14  ALKPHOS 48  BILITOT 0.3  PROT 6.4*  ALBUMIN 3.0*   Lab Results  Component Value Date   CALCIUM 7.4 (L) 07/16/2021          Plt: Lab Results  Component Value Date   PLT 188 07/16/2021         Recent Labs  Lab 07/16/21 1303 07/16/21 1314 07/16/21 2347 07/16/21 2351  WBC 6.3  --  6.1  --   NEUTROABS 4.2  --  4.3  --   HGB 7.9* 8.2* 7.0* 7.5*  HCT 24.4* 24.0* 22.3* 22.0*  MCV 97.2  --  98.7  --   PLT 188  --  181  --     HG/HCT  Down  from  baseline see below    Component Value Date/Time   HGB 7.5 (L) 07/16/2021 2351   HCT 22.0 (L) 07/16/2021 2351   MCV 98.7 07/16/2021 2347      Cardiac Panel (last 3 results) Recent Labs    07/16/21 2347  CKTOTAL 669*    Recent Labs    10/18/20 2043 07/16/21 1303  BNP 94.8 344.1*      DM  labs:  HbA1C: No results for input(s): HGBA1C in the last 8760 hours.     CBG (last 3)  No results for input(s): GLUCAP in the last 72 hours.  Cultures: No results found for: SDES, [place], CULT, REPTSTATUS   Radiological Exams on Admission: DG Chest 2 View  Result Date: 07/16/2021 CLINICAL DATA:  Fluid overload. 27 pound weight gain. Elevated creatinine. EXAM: CHEST - 2 VIEW COMPARISON:  None. FINDINGS: The heart is enlarged. Mild vascular congestion is present. Small effusions are present. Ill-defined scratched at minimal airspace opacity is present at both lung bases. IMPRESSION: Cardiomegaly with mild vascular congestion and small bilateral pleural effusions compatible with congestive heart failure. Electronically Signed   By: San Morelle M.D.   On: 07/16/2021 13:12   US RENAL  Result Date: 07/16/2021 CLINICAL DATA:  Acute kidney injury EXAM: RENAL / URINARY TRACT ULTRASOUND COMPLETE COMPARISON:  None. FINDINGS: Right Kidney: Renal measurements: 9.3 x 3.7 x 4.4 cm = volume: 78.8 mL. Cortex is slightly echogenic. No mass or hydronephrosis Left Kidney: Renal measurements: 9.3 x 5.5 x 4.7 cm = volume: 126.4 mL. Cortex is slightly echogenic. No mass or hydronephrosis Bladder: Appears normal for degree of bladder distention. Other: None. IMPRESSION: Slightly echogenic cortex consistent with medical renal disease. No hydronephrosis Electronically Signed   By: Donavan Foil M.D.   On: 07/16/2021 23:38   _______________________________________________________________________________________________________ Latest   Blood pressure (!) 169/86, pulse 75, temperature 98.1 F (36.7  C), temperature source Oral, resp. rate 16, SpO2 100 %.   Vitals  labs and radiology finding personally reviewed  Review of Systems:    Pertinent positives include:   fatigue, Bilateral lower extremity swelling   Constitutional:  No weight loss, night sweats, Fevers, chills,weight loss  HEENT:  No headaches, Difficulty swallowing,Tooth/dental problems,Sore throat,  No sneezing, itching, ear ache, nasal congestion, post nasal drip,  Cardio-vascular:  No chest pain, Orthopnea, PND, anasarca, dizziness, palpitations.no  GI:  No heartburn, indigestion, abdominal pain, nausea, vomiting, diarrhea, change in bowel habits, loss of appetite, melena, blood in stool, hematemesis Resp:  no shortness of breath at rest. No dyspnea on exertion, No excess mucus, no productive cough, No non-productive cough, No coughing up of blood.No change in color of mucus.No wheezing. Skin:  no rash or lesions. No jaundice GU:  no dysuria, change in color of urine, no urgency or frequency. No straining to urinate.  No flank pain.  Musculoskeletal:  No joint pain or no joint swelling. No decreased range of motion. No back pain.  Psych:  No change in mood or affect. No depression or anxiety. No memory loss.  Neuro: no localizing neurological complaints, no tingling, no weakness, no double vision, no gait abnormality, no slurred speech, no confusion  All systems reviewed and apart from [place] all are negative _______________________________________________________________________________________________ Past Medical History:   Past Medical History:  Diagnosis Date   Diabetes mellitus without complication ([place])    Hypertension       Past Surgical History:  Procedure Laterality Date   HERNIA REPAIR     ROTATOR CUFF REPAIR Right     Social History:  Ambulatory   independently  has been having worsening weakness     reports that he has never smoked. He has never used smokeless tobacco. He reports that  he does not drink alcohol and does not use drugs.     Family History:   Family History  Problem Relation Age of Onset   CAD Mother    Hypertension Sister    Renal Disease Sister    ______________________________________________________________________________________________ Allergies: Allergies  Allergen Reactions   Amlodipine Swelling and Other (See Comments)    Swelling of lower limbs  Prior to Admission medications   Medication Sig Start Date End Date Taking? Authorizing Provider  albuterol (VENTOLIN HFA) 108 (90 Base) MCG/ACT inhaler Inhale 1-2 puffs into the lungs every 6 (six) hours as needed for wheezing or shortness of breath. 10/18/20   Raspet, Derry Skill, PA-C  amLODipine (NORVASC) 10 MG tablet Take by mouth. 03/22/20   [provider]  atorvastatin (LIPITOR) 20 MG tablet Take 0.5 tablets by mouth at bedtime. 06/29/20   [provider]  B Complex Vitamins (B COMPLEX-B12) TABS Take 1 tablet by mouth daily.    [provider]  budesonide-formoterol (SYMBICORT) 160-4.5 MCG/ACT inhaler Inhale 2 puffs into the lungs in the morning and at bedtime. 10/18/20   Raspet, Derry Skill, PA-C  carvedilol (COREG) 25 MG tablet Take by mouth. 03/22/20   [provider]  doxycycline (VIBRAMYCIN) 100 MG capsule Take 1 capsule (100 mg total) by mouth 2 (two) times daily. 10/18/20   Raspet, Erin K, PA-C  glipiZIDE (GLUCOTROL) 5 MG tablet Take by mouth.    [provider]  hydrochlorothiazide (HYDRODIURIL) 12.5 MG tablet Take by mouth.    [provider]  losartan (COZAAR) 100 MG tablet Take 1 tablet by mouth daily. 07/16/20   [provider]  potassium chloride (KLOR-CON) 10 MEQ tablet Take by mouth.    [provider]    ___________________________________________________________________________________________________ Physical Exam: Vitals with BMI 07/16/2021 07/16/2021 07/16/2021  Height - - -  Weight - - -  BMI - - -  Systolic  810 175 102  Diastolic 86 585 98  Pulse 75 66 77     1. General:  in No  Acute distress   Chronically ill   -appearing 2. Psychological: Alert and  Oriented 3. Head/ENT:    Dry Mucous Membranes                          Head Non traumatic, neck supple                           Poor Dentition 4. SKIN: decreased Skin turgor,  Skin clean Dry and intact no rash 5. Heart: Regular rate and rhythm no  Murmur, no Rub or gallop 6. Lungs:  no wheezes or crackles   7. Abdomen: Soft,  non-tender, Non distended   obese  bowel sounds present 8. Lower extremities: no clubbing, cyanosis, 2+ edema 9. Neurologically Grossly intact, moving all 4 extremities equally   10. MSK: Normal range of motion    Chart has been reviewed  ______________________________________________________________________________________________  Assessment/Plan  66 y.o. male with medical history significant of chronic kidney disease stage V, hypertension hyperlipidemia diabetes type 2  Admitted for  acute on chronic worsening renal function with evidence of fluid overload  Present on Admission:  AKI (acute kidney injury) ([place])  Uncontrolled type 2 diabetes mellitus with hypoglycemia, without long-term current use of insulin (HCC)  Essential hypertension  Anemia  Obesity, Class III, BMI 40-49.9 (morbid obesity) ([place])  Cardiomegaly  (Resolved) Hypokalemia  Hyperkalemia     Uncontrolled type 2 diabetes mellitus with hypoglycemia, without long-term current use of insulin ([place])  - Order serial CBG.  Of note on regular labs noted to be hypoglycemic.  Will follow Hold home medications    -  check TSH and HgA1C      AKI (acute kidney injury) [facility]) Nephrology is aware appreciate the consult for right now plan to diuresis  and monitor patient continues to put out urine and appears to be fluid overloaded.  May need to titrate up diuretics as needed.  Obtain echogram.  Obtain urine studies.  Renal ultrasound.  N.p.o.  postmidnight in case needs dialysis catheter placement Hold Cozaar  Essential hypertension Can restart Norvasc hold hydrochlorothiazide and Cozaar can also   restrt coreg if able  Anemia Obtain anemia panel Hemoccult stool transfuse as needed for hemoglobin below 7  Will transfuse  1 unit  Obesity, Class III, BMI 40-49.9 (morbid obesity) ([place]) Will need outpatient follow-up with nutrition  Cardiomegaly Cardiomegaly with evidence of fluid overload.  Obtain echogram EKG nonischemic no chest pain Patient endorses orthopnea in the setting of fluid overload in the setting of severe chronic kidney disease  Hyperkalemia Order dose of Lokelma will order repeat labs Discussed with nephrology at this point feels that there is no indication for immediate dialysis.  No EKG changes    Other plan as per orders.  DVT prophylaxis:  SCD      Code Status:    Code Status: Not on file FULL CODE   as per patient   I had personally discussed CODE STATUS with patient and family    Family Communication:   Family  at  Bedside  plan of care was discussed   with  Daughter, Wife,   Disposition Plan:     To home once workup is complete and patient is stable   Following barriers for discharge:                            Electrolytes corrected                                                         Will need to be able to tolerate PO                                                        Will need consultants to evaluate patient prior to discharge                                          Diabetes care coordinator                                       Nutrition    consulted                 Consults called: Nephrology is aware  Admission status:  ED Disposition     ED Disposition  Admit   Condition  --   Comment  The patient appears reasonably stabilized for admission considering the current resources, flow, and capabilities available in the ED at this time, and I doubt any other [facility]  requiring further screening and/or treatment in the ED prior to admission is  present.         inpatient     I Expect  2 midnight stay secondary to severity of patient's current illness need for inpatient interventions justified by the following:    Severe lab/radiological/exam abnormalities including:   AKI hyperkalemia and extensive comorbidities including:    DM2     COPD/asthma  Morbid Obesity  CKD    That are currently affecting medical management.   I expect  patient to be hospitalized for 2 midnights requiring inpatient medical care.  Patient is at high risk for adverse outcome (such as loss of life or disability) if not treated.  Indication for inpatient stay as follows:    Need for operative/procedural  intervention     Need for IV diuretics may need dialysis during this admission    Level of care        progressive tele indefinitely please discontinue once patient no longer qualifies COVID-19 Labs    Lab Results  Component Value Date   [place] 07/16/2021     Precautions: admitted as   Covid Negative     Tiffony Kite 07/17/2021, 1:08 AM    [facility]     after 2 AM please page floor coverage PA If 7AM-7PM, please contact the day team taking care of the patient using [email or web address]   Patient was evaluated in the context of the global COVID-19 pandemic, which necessitated consideration that the patient might be at risk for infection with the SARS-CoV-2 virus that causes COVID-19. Institutional protocols and algorithms that pertain to the evaluation of patients at risk for COVID-19 are in a state of rapid change based on information released by regulatory bodies including the CDC and federal and state organizations. These policies and algorithms were followed during the patient's care.

## 2021-07-16 NOTE — Assessment & Plan Note (Signed)
Cardiomegaly with evidence of fluid overload.  Obtain echogram EKG nonischemic no chest pain Patient endorses orthopnea in the setting of fluid overload in the setting of severe chronic kidney disease

## 2021-07-16 NOTE — Assessment & Plan Note (Signed)
Will need outpatient follow-up with nutrition 

## 2021-07-16 NOTE — Assessment & Plan Note (Addendum)
Obtain anemia panel Hemoccult stool transfuse as needed for hemoglobin below 7  Will transfuse  1 unit

## 2021-07-16 NOTE — Assessment & Plan Note (Signed)
Order dose of Lokelma will order repeat labs Discussed with nephrology at this point feels that there is no indication for immediate dialysis.  No EKG changes

## 2021-07-16 NOTE — Assessment & Plan Note (Addendum)
-   Order serial CBG.  Of note on regular labs noted to be hypoglycemic.  Will follow Hold home medications    -  check TSH and HgA1C

## 2021-07-16 NOTE — Subjective & Objective (Signed)
Brought in by [facility] from [facility] [place] when he was noted to have elevated creatinine up to 14.8 as well as 27 pound weight gain since November.  Patient not endorsing any complaints

## 2021-07-16 NOTE — ED Triage Notes (Signed)
Patient BIB [facility] from [place] for elevated creatinine (13.8) and 27 pound weight gain since November 2022. Patient alert, oriented, and in no apparent distress at this time.  BP 165/111 CBG 89

## 2021-07-16 NOTE — ED Provider Notes (Signed)
[facility] EMERGENCY DEPARTMENT Provider Note   CSN: [id number] Arrival date & time: 07/16/21  1218     History  No chief complaint on file.   [place] is a 66 y.o. male.  HPI   66 year old male with a history of CKD, depression, DM 2, erectile dysfunction, HTN, HLD, obesity who presents the emergency department from the [place] with a chief complaint of abnormal labs with worsening renal function.  He has had significant weight gain since November and was sent to the emergency department from the [place] due to acute on chronic renal failure.  The patient denies any confusion.  He was alert and oriented x3, GCS 15 on arrival.  He endorses shortness of breath, orthopnea and dyspnea on exertion.  He has had bilateral lower extremity edema that has been worsening since November with a 27 pound weight gain.  He denies any decreased urine output and states that he still makes plenty of urine.  Denies any fever, chills, cough.  He feels most short of breath when attempting to lie flat at night and this has disturbed sleep.  He denies any chest pain.  Home Medications Prior to Admission medications   Medication Sig Start Date End Date Taking? Authorizing Provider  acetaminophen (TYLENOL) 325 MG tablet Take 650 mg by mouth every 6 (six) hours as needed for moderate pain or headache.   Yes [provider]  albuterol (VENTOLIN HFA) 108 (90 Base) MCG/ACT inhaler Inhale 1-2 puffs into the lungs every 6 (six) hours as needed for wheezing or shortness of breath. 10/18/20  Yes Raspet, Erin K, PA-C  atorvastatin (LIPITOR) 20 MG tablet Take 10 mg by mouth at bedtime. 06/29/20  Yes [provider]  B Complex Vitamins (B COMPLEX-B12) TABS Take 2 tablets by mouth daily.   Yes [provider]  budesonide-formoterol (SYMBICORT) 160-4.5 MCG/ACT inhaler Inhale 2 puffs into the lungs in the morning and at bedtime. 10/18/20  Yes Raspet, Erin K, PA-C  carvedilol  (COREG) 25 MG tablet Take 25 mg by mouth 2 (two) times daily with a meal. 03/22/20  Yes [provider]  glipiZIDE (GLUCOTROL) 5 MG tablet Take 5 mg by mouth 2 (two) times daily before a meal.   Yes [provider]  losartan (COZAAR) 100 MG tablet Take 100 mg by mouth at bedtime. 07/16/20  Yes [provider]  doxycycline (VIBRAMYCIN) 100 MG capsule Take 1 capsule (100 mg total) by mouth 2 (two) times daily. Patient not taking: Reported on 07/16/2021 10/18/20   Raspet, Junie Panning K, PA-C  hydrochlorothiazide (HYDRODIURIL) 12.5 MG tablet Take by mouth. Patient not taking: Reported on 07/16/2021    [provider]  potassium chloride (KLOR-CON) 10 MEQ tablet Take by mouth. Patient not taking: Reported on 07/16/2021    [provider]      Allergies    Amlodipine    Review of Systems   Review of Systems  Respiratory:  Positive for shortness of breath.   Cardiovascular:  Positive for leg swelling.  All other systems reviewed and are negative.  Physical Exam Updated Vital Signs BP (!) 169/86    Pulse 75    Temp 98.1 F (36.7 C) (Oral)    Resp 16    SpO2 100%  Physical Exam Vitals and nursing note reviewed.  Constitutional:      General: He is not in acute distress.    Appearance: He is well-developed. He is obese. He is not ill-appearing.  HENT:     Head: Normocephalic and atraumatic.  Eyes:     Conjunctiva/sclera: Conjunctivae normal.     Pupils: Pupils are equal, round, and reactive to light.  Cardiovascular:     Rate and Rhythm: Normal rate and regular rhythm.     Pulses: Normal pulses.     Heart sounds: No murmur heard. Pulmonary:     Effort: Pulmonary effort is normal. No respiratory distress.     Breath sounds: Normal breath sounds.  Abdominal:     General: There is no distension.     Palpations: Abdomen is soft.     Tenderness: There is no abdominal tenderness. There is no guarding.  Musculoskeletal:        General: No deformity or signs  of injury.     Cervical back: Neck supple.     Right lower leg: Edema present.     Left lower leg: Edema present.     Comments: 3+ pitting edema bilaterally in the lower extremities to the knee.  Skin:    General: Skin is warm and dry.     Capillary Refill: Capillary refill takes less than 2 seconds.     Findings: No lesion or rash.  Neurological:     General: No focal deficit present.     Mental Status: He is alert. Mental status is at baseline.  Psychiatric:        Mood and Affect: Mood normal.    ED Results / Procedures / Treatments   Labs (all labs ordered are listed, but only abnormal results are displayed) Labs Reviewed  BRAIN NATRIURETIC PEPTIDE - Abnormal; Notable for the following components:      Result Value   B Natriuretic Peptide 344.1 (*)    All other components within normal limits  COMPREHENSIVE METABOLIC PANEL - Abnormal; Notable for the following components:   Potassium 5.5 (*)    Chloride 113 (*)    CO2 16 (*)    BUN 115 (*)    Creatinine, Ser 16.58 (*)    Calcium 7.4 (*)    Total Protein 6.4 (*)    Albumin 3.0 (*)    AST 14 (*)    GFR, Estimated 3 (*)    All other components within normal limits  CBC WITH DIFFERENTIAL/PLATELET - Abnormal; Notable for the following components:   RBC 2.51 (*)    Hemoglobin 7.9 (*)    HCT 24.4 (*)    RDW 17.2 (*)    All other components within normal limits  I-STAT CHEM 8, ED - Abnormal; Notable for the following components:   Potassium 5.5 (*)    Chloride 113 (*)    BUN 129 (*)    Creatinine, Ser >18.00 (*)    Glucose, Bld 65 (*)    Calcium, Ion 1.01 (*)    TCO2 18 (*)    Hemoglobin 8.2 (*)    HCT 24.0 (*)    All other components within normal limits  RESP PANEL BY RT-PCR (FLU A&B, COVID) ARPGX2  HEMOGLOBIN A1C  VITAMIN B12  FOLATE  IRON AND TIBC  FERRITIN  RETICULOCYTES  URINALYSIS, ROUTINE W REFLEX MICROSCOPIC  DIFFERENTIAL  CK  HEPATIC FUNCTION PANEL  MAGNESIUM  PHOSPHORUS  OSMOLALITY, URINE   SODIUM, URINE, RANDOM  TSH  CREATININE, URINE, RANDOM  BASIC METABOLIC PANEL  I-STAT CHEM 8, ED  CBG MONITORING, ED  TROPONIN I (HIGH SENSITIVITY)    EKG EKG Interpretation  Date/Time:  Tuesday July 16 2021 21:39:21 EST Ventricular Rate:  78 PR Interval:  165 QRS Duration: 81 QT Interval:  409 QTC Calculation: 466 R Axis:   41 Text Interpretation: Sinus rhythm Confirmed by Regan Lemming ([id number]) on 07/16/2021 9:53:24 PM  Radiology DG Chest 2 View  Result Date: 07/16/2021 CLINICAL DATA:  Fluid overload. 27 pound weight gain. Elevated creatinine. EXAM: CHEST - 2 VIEW COMPARISON:  None. FINDINGS: The heart is enlarged. Mild vascular congestion is present. Small effusions are present. Ill-defined scratched at minimal airspace opacity is present at both lung bases. IMPRESSION: Cardiomegaly with mild vascular congestion and small bilateral pleural effusions compatible with congestive heart failure. Electronically Signed   By: San Morelle M.D.   On: 07/16/2021 13:12    Procedures Procedures    Medications Ordered in ED Medications  sodium zirconium cyclosilicate (LOKELMA) packet 10 g (has no administration in time range)  furosemide (LASIX) injection 40 mg (has no administration in time range)  furosemide (LASIX) injection 80 mg (has no administration in time range)    ED Course/ Medical Decision Making/ A&P                           Medical Decision Making Risk Prescription drug management. Decision regarding hospitalization.   66 year old male with a history of CKD, depression, DM 2, erectile dysfunction, HTN, HLD, obesity who presents the emergency department from the [place] with a chief complaint of abnormal labs with worsening renal function.  He has had significant weight gain since November and was sent to the emergency department from the [place] due to acute on chronic renal failure.  The patient denies any confusion.  He was alert and oriented x3, GCS 15 on  arrival.  He endorses shortness of breath, orthopnea and dyspnea on exertion.  He has had bilateral lower extremity edema that has been worsening since November with a 27 pound weight gain.  He denies any decreased urine output and states that he still makes plenty of urine.  Denies any fever, chills, cough.  He feels most short of breath when attempting to lie flat at night and this has disturbed sleep.  He denies any chest pain.  On arrival, the patient was afebrile, hemodynamically stable, hypertensive BP 199/104, subsequently improved to 185/105.  Patient was saturating well 99% on room air.  Sinus rhythm noted on cardiac telemetry.  The patient had an EKG which revealed normal sinus rhythm, ventricular rate 78, no significantly abnormal intervals, no ST segment changes to indicate ischemia.  A chest x-ray was performed which revealed cardiomegaly with mild vascular congestion and small bilateral pleural effusions consistent compatible with congestive heart failure.  Evaluated the patient's chest x-ray and agree with the radiology interpretation.  The patient has a known history of CKD.  I do not see a history of CHF.  Concern for AKI on CKD.  The patient still makes urine at this time so will administer 40 mg of IV Lasix.  [facility], I see no diagnosis of heart failure.  Concern for new onset heart failure in the setting of acute on chronic renal failure.  Laboratory work-up significant for CBC with no leukocytosis, anemia noted to 7.9, suspect likely anemia of CKD, CMP with mild hyperkalemia to 5.5, elevated BUN to 115, elevated creatinine of 16.58.  I did speak with Dr. Carolin Sicks of nephrology who agreed with the plan for medical management with [facility], admission for diuresis.  BUN was mildly elevated 344.  The patient  denied any active chest pain.  He is afebrile without a cough and no leukocytosis.  Low concern for pneumonia at this time.  Low concern for other infectious  process.  Plan for admission for IV diuresis with nephrology consulting inpatient.  Dr. Roel Cluck of hospitalist medicine consulted for admission the patient was admitted in stable condition.  Final Clinical Impression(s) / ED Diagnoses Final diagnoses:  AKI (acute kidney injury) ([place])  Hyperkalemia  Elevated BUN  Hypervolemia associated with renal insufficiency    Rx / DC Orders ED Discharge Orders     None         Regan Lemming, MD 07/16/21 [id number]

## 2021-07-16 NOTE — ED Provider Triage Note (Signed)
Emergency Medicine Provider Triage Evaluation Note  Marcus [name] , a 66 y.o. male  was evaluated in triage.  Pt complains of abnormal labs.  Patient was seen at [facility] earlier today and creatinine found to be elevated at 13.8.  Patient does have history of CKD but is not on any dialysis.  Patient states that he has had swelling to bilateral lower extremities and "[name]," shortness of breath of the last 3 to 4 days.  Patient endorses 27 pound weight gain since November.  Denies any decrease in urination.  Review of Systems  Positive: Leg swelling, shortness of breath, Negative: Chest pain, abdominal pain, dysuria, hematuria, decreased urinary output  Physical Exam  BP (!) 199/104    Pulse 75    Temp 99 F (37.2 C) (Oral)    Resp 18    SpO2 100%  Gen:   Awake, no distress   Resp:  Normal effort  MSK:   Moves extremities without difficulty; edema to bilateral lower extremities Other:    Medical Decision Making  Medically screening exam initiated at 12:57 PM.  Appropriate orders placed.  [place] was informed that the remainder of the evaluation will be completed by another provider, this initial triage assessment does not replace that evaluation, and the importance of remaining in the ED until their evaluation is complete.     Marcus [name], [place] 07/16/21 1259

## 2021-07-16 NOTE — Assessment & Plan Note (Signed)
Nephrology is aware appreciate the consult for right now plan to diuresis and monitor patient continues to put out urine and appears to be fluid overloaded.  May need to titrate up diuretics as needed.  Obtain echogram.  Obtain urine studies.  Renal ultrasound.  N.p.o. postmidnight in case needs dialysis catheter placement Hold Cozaar

## 2021-07-17 ENCOUNTER — Inpatient Hospital Stay (HOSPITAL_COMMUNITY): Payer: No Typology Code available for payment source

## 2021-07-17 ENCOUNTER — Encounter (HOSPITAL_COMMUNITY): Payer: Self-pay | Admitting: Internal Medicine

## 2021-07-17 ENCOUNTER — Other Ambulatory Visit: Payer: Self-pay

## 2021-07-17 ENCOUNTER — Encounter (HOSPITAL_COMMUNITY): Payer: No Typology Code available for payment source

## 2021-07-17 DIAGNOSIS — I517 Cardiomegaly: Secondary | ICD-10-CM | POA: Diagnosis not present

## 2021-07-17 DIAGNOSIS — N179 Acute kidney failure, unspecified: Secondary | ICD-10-CM | POA: Diagnosis not present

## 2021-07-17 HISTORY — PX: IR FLUORO GUIDE CV LINE RIGHT: IMG2283

## 2021-07-17 HISTORY — PX: IR US GUIDE VASC ACCESS RIGHT: IMG2390

## 2021-07-17 LAB — URINALYSIS, ROUTINE W REFLEX MICROSCOPIC
Bacteria, UA: NONE SEEN
Bilirubin Urine: NEGATIVE
Glucose, UA: 150 mg/dL — AB
Ketones, ur: NEGATIVE mg/dL
Leukocytes,Ua: NEGATIVE
Nitrite: NEGATIVE
Protein, ur: >=300 mg/dL — AB
Specific Gravity, Urine: 1.01 (ref 1.005–1.030)
pH: 5 (ref 5.0–8.0)

## 2021-07-17 LAB — I-STAT VENOUS BLOOD GAS, ED
Acid-base deficit: 9 mmol/L — ABNORMAL HIGH (ref 0.0–2.0)
Bicarbonate: 16.9 mmol/L — ABNORMAL LOW (ref 20.0–28.0)
Calcium, Ion: 0.98 mmol/L — ABNORMAL LOW (ref 1.15–1.40)
HCT: 21 % — ABNORMAL LOW (ref 39.0–52.0)
Hemoglobin: 7.1 g/dL — ABNORMAL LOW (ref 13.0–17.0)
O2 Saturation: 99 %
Potassium: 4.7 mmol/L (ref 3.5–5.1)
Sodium: 143 mmol/L (ref 135–145)
TCO2: 18 mmol/L — ABNORMAL LOW (ref 22–32)
pCO2, Ven: 37.7 mmHg — ABNORMAL LOW (ref 44.0–60.0)
pH, Ven: 7.26 (ref 7.250–7.430)
pO2, Ven: 166 mmHg — ABNORMAL HIGH (ref 32.0–45.0)

## 2021-07-17 LAB — HIV ANTIBODY (ROUTINE TESTING W REFLEX): HIV Screen 4th Generation wRfx: NONREACTIVE

## 2021-07-17 LAB — HEPATIC FUNCTION PANEL
ALT: 15 U/L (ref 0–44)
AST: 10 U/L — ABNORMAL LOW (ref 15–41)
Albumin: 2.8 g/dL — ABNORMAL LOW (ref 3.5–5.0)
Alkaline Phosphatase: 43 U/L (ref 38–126)
Bilirubin, Direct: <0.1 mg/dL (ref 0.0–0.2)
Total Bilirubin: 0.3 mg/dL (ref 0.3–1.2)
Total Protein: 6 g/dL — ABNORMAL LOW (ref 6.5–8.1)

## 2021-07-17 LAB — CBC WITH DIFFERENTIAL/PLATELET
Abs Immature Granulocytes: 0.04 10*3/uL (ref 0.00–0.07)
Abs Immature Granulocytes: 0.04 10*3/uL (ref 0.00–0.07)
Basophils Absolute: 0 10*3/uL (ref 0.0–0.1)
Basophils Absolute: 0 10*3/uL (ref 0.0–0.1)
Basophils Relative: 1 %
Basophils Relative: 1 %
Eosinophils Absolute: 0.4 10*3/uL (ref 0.0–0.5)
Eosinophils Absolute: 0.4 10*3/uL (ref 0.0–0.5)
Eosinophils Relative: 7 %
Eosinophils Relative: 8 %
HCT: 22.3 % — ABNORMAL LOW (ref 39.0–52.0)
HCT: 22.7 % — ABNORMAL LOW (ref 39.0–52.0)
Hemoglobin: 7 g/dL — ABNORMAL LOW (ref 13.0–17.0)
Hemoglobin: 7.1 g/dL — ABNORMAL LOW (ref 13.0–17.0)
Immature Granulocytes: 1 %
Immature Granulocytes: 1 %
Lymphocytes Relative: 12 %
Lymphocytes Relative: 14 %
Lymphs Abs: 0.7 10*3/uL (ref 0.7–4.0)
Lymphs Abs: 0.8 10*3/uL (ref 0.7–4.0)
MCH: 31 pg (ref 26.0–34.0)
MCH: 31.3 pg (ref 26.0–34.0)
MCHC: 31.3 g/dL (ref 30.0–36.0)
MCHC: 31.4 g/dL (ref 30.0–36.0)
MCV: 100 fL (ref 80.0–100.0)
MCV: 98.7 fL (ref 80.0–100.0)
Monocytes Absolute: 0.6 10*3/uL (ref 0.1–1.0)
Monocytes Absolute: 0.6 10*3/uL (ref 0.1–1.0)
Monocytes Relative: 10 %
Monocytes Relative: 9 %
Neutro Abs: 3.9 10*3/uL (ref 1.7–7.7)
Neutro Abs: 4.3 10*3/uL (ref 1.7–7.7)
Neutrophils Relative %: 66 %
Neutrophils Relative %: 70 %
Platelets: 181 10*3/uL (ref 150–400)
Platelets: 183 10*3/uL (ref 150–400)
RBC: 2.26 MIL/uL — ABNORMAL LOW (ref 4.22–5.81)
RBC: 2.27 MIL/uL — ABNORMAL LOW (ref 4.22–5.81)
RDW: 17.5 % — ABNORMAL HIGH (ref 11.5–15.5)
RDW: 17.5 % — ABNORMAL HIGH (ref 11.5–15.5)
WBC: 5.8 10*3/uL (ref 4.0–10.5)
WBC: 6.1 10*3/uL (ref 4.0–10.5)
nRBC: 0 % (ref 0.0–0.2)
nRBC: 0 % (ref 0.0–0.2)

## 2021-07-17 LAB — RETICULOCYTES
Immature Retic Fract: 12.7 % (ref 2.3–15.9)
RBC.: 2.29 MIL/uL — ABNORMAL LOW (ref 4.22–5.81)
Retic Count, Absolute: 158 10*3/uL (ref 19.0–186.0)
Retic Ct Pct: 6.9 % — ABNORMAL HIGH (ref 0.4–3.1)

## 2021-07-17 LAB — RENAL FUNCTION PANEL
Albumin: 2.8 g/dL — ABNORMAL LOW (ref 3.5–5.0)
Anion gap: 14 (ref 5–15)
BUN: 110 mg/dL — ABNORMAL HIGH (ref 8–23)
CO2: 17 mmol/L — ABNORMAL LOW (ref 22–32)
Calcium: 7.2 mg/dL — ABNORMAL LOW (ref 8.9–10.3)
Chloride: 111 mmol/L (ref 98–111)
Creatinine, Ser: 16.12 mg/dL — ABNORMAL HIGH (ref 0.61–1.24)
GFR, Estimated: 3 mL/min — ABNORMAL LOW (ref >60)
Glucose, Bld: 98 mg/dL (ref 70–99)
Phosphorus: 8.2 mg/dL — ABNORMAL HIGH (ref 2.5–4.6)
Potassium: 4.6 mmol/L (ref 3.5–5.1)
Sodium: 142 mmol/L (ref 135–145)

## 2021-07-17 LAB — BASIC METABOLIC PANEL
Anion gap: 12 (ref 5–15)
BUN: 112 mg/dL — ABNORMAL HIGH (ref 8–23)
CO2: 16 mmol/L — ABNORMAL LOW (ref 22–32)
Calcium: 7.1 mg/dL — ABNORMAL LOW (ref 8.9–10.3)
Chloride: 114 mmol/L — ABNORMAL HIGH (ref 98–111)
Creatinine, Ser: 16.04 mg/dL — ABNORMAL HIGH (ref 0.61–1.24)
GFR, Estimated: 3 mL/min — ABNORMAL LOW (ref >60)
Glucose, Bld: 147 mg/dL — ABNORMAL HIGH (ref 70–99)
Potassium: 4.9 mmol/L (ref 3.5–5.1)
Sodium: 142 mmol/L (ref 135–145)

## 2021-07-17 LAB — CBC
HCT: 24.6 % — ABNORMAL LOW (ref 39.0–52.0)
Hemoglobin: 8.1 g/dL — ABNORMAL LOW (ref 13.0–17.0)
MCH: 30.9 pg (ref 26.0–34.0)
MCHC: 32.9 g/dL (ref 30.0–36.0)
MCV: 93.9 fL (ref 80.0–100.0)
Platelets: 190 10*3/uL (ref 150–400)
RBC: 2.62 MIL/uL — ABNORMAL LOW (ref 4.22–5.81)
RDW: 17.4 % — ABNORMAL HIGH (ref 11.5–15.5)
WBC: 6 10*3/uL (ref 4.0–10.5)
nRBC: 0 % (ref 0.0–0.2)

## 2021-07-17 LAB — CK: Total CK: 669 U/L — ABNORMAL HIGH (ref 49–397)

## 2021-07-17 LAB — COMPREHENSIVE METABOLIC PANEL
ALT: 15 U/L (ref 0–44)
AST: 12 U/L — ABNORMAL LOW (ref 15–41)
Albumin: 2.8 g/dL — ABNORMAL LOW (ref 3.5–5.0)
Alkaline Phosphatase: 43 U/L (ref 38–126)
Anion gap: 17 — ABNORMAL HIGH (ref 5–15)
BUN: 111 mg/dL — ABNORMAL HIGH (ref 8–23)
CO2: 16 mmol/L — ABNORMAL LOW (ref 22–32)
Calcium: 7.2 mg/dL — ABNORMAL LOW (ref 8.9–10.3)
Chloride: 110 mmol/L (ref 98–111)
Creatinine, Ser: 16.1 mg/dL — ABNORMAL HIGH (ref 0.61–1.24)
GFR, Estimated: 3 mL/min — ABNORMAL LOW (ref >60)
Glucose, Bld: 141 mg/dL — ABNORMAL HIGH (ref 70–99)
Potassium: 4.7 mmol/L (ref 3.5–5.1)
Sodium: 143 mmol/L (ref 135–145)
Total Bilirubin: 0.2 mg/dL — ABNORMAL LOW (ref 0.3–1.2)
Total Protein: 6.1 g/dL — ABNORMAL LOW (ref 6.5–8.1)

## 2021-07-17 LAB — FERRITIN: Ferritin: 222 ng/mL (ref 24–336)

## 2021-07-17 LAB — OSMOLALITY, URINE: Osmolality, Ur: 315 mOsm/kg (ref 300–900)

## 2021-07-17 LAB — CBG MONITORING, ED
Glucose-Capillary: 144 mg/dL — ABNORMAL HIGH (ref 70–99)
Glucose-Capillary: 94 mg/dL (ref 70–99)
Glucose-Capillary: 71 mg/dL (ref 70–99)
Glucose-Capillary: 63 mg/dL — ABNORMAL LOW (ref 70–99)
Glucose-Capillary: 69 mg/dL — ABNORMAL LOW (ref 70–99)
Glucose-Capillary: 90 mg/dL (ref 70–99)
Glucose-Capillary: 188 mg/dL — ABNORMAL HIGH (ref 70–99)

## 2021-07-17 LAB — ECHOCARDIOGRAM COMPLETE: S' Lateral: 3.4 cm

## 2021-07-17 LAB — VITAMIN B12: Vitamin B-12: 395 pg/mL (ref 180–914)

## 2021-07-17 LAB — ABO/RH: ABO/RH(D): O POS

## 2021-07-17 LAB — CREATININE, URINE, RANDOM: Creatinine, Urine: 64.1 mg/dL

## 2021-07-17 LAB — IRON AND TIBC
Iron: 64 ug/dL (ref 45–182)
Saturation Ratios: 31 % (ref 17.9–39.5)
TIBC: 204 ug/dL — ABNORMAL LOW (ref 250–450)
UIBC: 140 ug/dL

## 2021-07-17 LAB — HEPATITIS B SURFACE ANTIBODY,QUALITATIVE: Hep B S Ab: NONREACTIVE

## 2021-07-17 LAB — MAGNESIUM
Magnesium: 1.6 mg/dL — ABNORMAL LOW (ref 1.7–2.4)
Magnesium: 1.6 mg/dL — ABNORMAL LOW (ref 1.7–2.4)

## 2021-07-17 LAB — TSH
TSH: 2.383 u[IU]/mL (ref 0.350–4.500)
TSH: 2.567 u[IU]/mL (ref 0.350–4.500)

## 2021-07-17 LAB — HEMOGLOBIN A1C
Hgb A1c MFr Bld: 5 % (ref 4.8–5.6)
Mean Plasma Glucose: 96.8 mg/dL

## 2021-07-17 LAB — GLUCOSE, CAPILLARY: Glucose-Capillary: 169 mg/dL — ABNORMAL HIGH (ref 70–99)

## 2021-07-17 LAB — HEPATITIS B SURFACE ANTIGEN: Hepatitis B Surface Ag: NONREACTIVE

## 2021-07-17 LAB — PREPARE RBC (CROSSMATCH)

## 2021-07-17 LAB — SODIUM, URINE, RANDOM: Sodium, Ur: 67 mmol/L

## 2021-07-17 LAB — FOLATE: Folate: 7.6 ng/mL (ref >5.9)

## 2021-07-17 LAB — PHOSPHORUS
Phosphorus: 7.5 mg/dL — ABNORMAL HIGH (ref 2.5–4.6)
Phosphorus: 7.8 mg/dL — ABNORMAL HIGH (ref 2.5–4.6)

## 2021-07-17 LAB — TROPONIN I (HIGH SENSITIVITY): Troponin I (High Sensitivity): 36 ng/L — ABNORMAL HIGH (ref <18)

## 2021-07-17 MED ORDER — CARVEDILOL 25 MG PO TABS
25.0000 mg | ORAL_TABLET | Freq: Two times a day (BID) | ORAL | Status: DC
  Administered 2021-07-17 – 2021-07-22 (×11): 25 mg via ORAL
  Filled 2021-07-17 (×7): qty 1
  Filled 2021-07-17: qty 2
  Filled 2021-07-17 (×2): qty 1
  Filled 2021-07-17: qty 2

## 2021-07-17 MED ORDER — LIDOCAINE-EPINEPHRINE 1 %-1:100000 IJ SOLN
INTRAMUSCULAR | Status: AC
  Filled 2021-07-17: qty 2

## 2021-07-17 MED ORDER — SODIUM CHLORIDE 0.9% FLUSH: 3.0000 mL | INTRAVENOUS | Status: DC | PRN

## 2021-07-17 MED ORDER — CEFAZOLIN SODIUM-DEXTROSE 2-4 GM/100ML-% IV SOLN
2.0000 g | INTRAVENOUS | Status: AC
  Administered 2021-07-17: 2 g via INTRAVENOUS

## 2021-07-17 MED ORDER — ALTEPLASE 2 MG IJ SOLR
2.0000 mg | Freq: Once | INTRAMUSCULAR | Status: DC | PRN
  Filled 2021-07-17: qty 2

## 2021-07-17 MED ORDER — SODIUM CHLORIDE 0.9 % IV SOLN: 100.0000 mL | INTRAVENOUS | Status: DC | PRN

## 2021-07-17 MED ORDER — MIDAZOLAM HCL 2 MG/2ML IJ SOLN
INTRAMUSCULAR | Status: AC
  Filled 2021-07-17: qty 2

## 2021-07-17 MED ORDER — CEFAZOLIN SODIUM-DEXTROSE 2-4 GM/100ML-% IV SOLN
INTRAVENOUS | Status: AC
  Filled 2021-07-17: qty 100

## 2021-07-17 MED ORDER — ONDANSETRON HCL 4 MG/2ML IJ SOLN
4.0000 mg | Freq: Four times a day (QID) | INTRAMUSCULAR | Status: DC | PRN
  Administered 2021-07-17: 4 mg via INTRAVENOUS
  Filled 2021-07-17: qty 2

## 2021-07-17 MED ORDER — ALBUTEROL SULFATE (2.5 MG/3ML) 0.083% IN NEBU: 2.5000 mg | INHALATION_SOLUTION | RESPIRATORY_TRACT | Status: DC | PRN

## 2021-07-17 MED ORDER — SODIUM CHLORIDE 0.9% FLUSH
3.0000 mL | Freq: Two times a day (BID) | INTRAVENOUS | Status: DC
  Administered 2021-07-17 – 2021-07-22 (×9): 3 mL via INTRAVENOUS

## 2021-07-17 MED ORDER — FENTANYL CITRATE (PF) 100 MCG/2ML IJ SOLN
INTRAMUSCULAR | Status: AC
  Filled 2021-07-17: qty 2

## 2021-07-17 MED ORDER — FENTANYL CITRATE (PF) 100 MCG/2ML IJ SOLN
INTRAMUSCULAR | Status: AC | PRN
  Administered 2021-07-17: 25 ug via INTRAVENOUS

## 2021-07-17 MED ORDER — PENTAFLUOROPROP-TETRAFLUOROETH EX AERO: 1.0000 "application " | INHALATION_SPRAY | CUTANEOUS | Status: DC | PRN

## 2021-07-17 MED ORDER — HEPARIN SODIUM (PORCINE) 1000 UNIT/ML IJ SOLN
INTRAMUSCULAR | Status: AC
  Administered 2021-07-17: 3.8 mL
  Filled 2021-07-17: qty 10

## 2021-07-17 MED ORDER — ACETAMINOPHEN 650 MG RE SUPP: 650.0000 mg | Freq: Four times a day (QID) | RECTAL | Status: DC | PRN

## 2021-07-17 MED ORDER — DARBEPOETIN ALFA 100 MCG/0.5ML IJ SOSY: 100.0000 ug | PREFILLED_SYRINGE | INTRAMUSCULAR | Status: DC

## 2021-07-17 MED ORDER — ATORVASTATIN CALCIUM 10 MG PO TABS
10.0000 mg | ORAL_TABLET | Freq: Every day | ORAL | Status: DC
  Administered 2021-07-17 – 2021-07-21 (×6): 10 mg via ORAL
  Filled 2021-07-17 (×6): qty 1

## 2021-07-17 MED ORDER — LIDOCAINE-EPINEPHRINE 1 %-1:100000 IJ SOLN
INTRAMUSCULAR | Status: AC
  Administered 2021-07-17: 15 mL via SUBCUTANEOUS
  Filled 2021-07-17: qty 1

## 2021-07-17 MED ORDER — FUROSEMIDE 10 MG/ML IJ SOLN
80.0000 mg | Freq: Once | INTRAMUSCULAR | Status: AC
  Administered 2021-07-17: 80 mg via INTRAVENOUS
  Filled 2021-07-17: qty 8

## 2021-07-17 MED ORDER — MIDAZOLAM HCL 2 MG/2ML IJ SOLN
INTRAMUSCULAR | Status: AC | PRN
  Administered 2021-07-17: 1 mg via INTRAVENOUS
  Administered 2021-07-17: .5 mg via INTRAVENOUS

## 2021-07-17 MED ORDER — LIDOCAINE HCL (PF) 1 % IJ SOLN: 5.0000 mL | INTRAMUSCULAR | Status: DC | PRN

## 2021-07-17 MED ORDER — MOMETASONE FURO-FORMOTEROL FUM 200-5 MCG/ACT IN AERO
2.0000 | INHALATION_SPRAY | Freq: Two times a day (BID) | RESPIRATORY_TRACT | Status: DC
  Administered 2021-07-18 – 2021-07-20 (×4): 2 via RESPIRATORY_TRACT
  Filled 2021-07-17 (×2): qty 8.8

## 2021-07-17 MED ORDER — HYDROCODONE-ACETAMINOPHEN 5-325 MG PO TABS
1.0000 | ORAL_TABLET | ORAL | Status: DC | PRN
  Administered 2021-07-17 – 2021-07-21 (×3): 1 via ORAL
  Administered 2021-07-21: 2 via ORAL
  Filled 2021-07-17: qty 2
  Filled 2021-07-17 (×2): qty 1
  Filled 2021-07-17: qty 2

## 2021-07-17 MED ORDER — ACETAMINOPHEN 325 MG PO TABS: 650.0000 mg | ORAL_TABLET | Freq: Four times a day (QID) | ORAL | Status: DC | PRN

## 2021-07-17 MED ORDER — GELATIN ABSORBABLE 12-7 MM EX MISC
CUTANEOUS | Status: AC
  Filled 2021-07-17: qty 1

## 2021-07-17 MED ORDER — LIDOCAINE-EPINEPHRINE 1 %-1:100000 IJ SOLN
INTRAMUSCULAR | Status: AC
  Filled 2021-07-17: qty 1

## 2021-07-17 MED ORDER — INSULIN ASPART 100 UNIT/ML IJ SOLN: 0.0000 [IU] | INTRAMUSCULAR | Status: DC

## 2021-07-17 MED ORDER — LIDOCAINE-PRILOCAINE 2.5-2.5 % EX CREA: 1.0000 "application " | TOPICAL_CREAM | CUTANEOUS | Status: DC | PRN

## 2021-07-17 MED ORDER — DEXTROSE-NACL 5-0.45 % IV SOLN: INTRAVENOUS | Status: DC

## 2021-07-17 MED ORDER — SODIUM CHLORIDE 0.9 % IV SOLN: 250.0000 mL | INTRAVENOUS | Status: DC | PRN

## 2021-07-17 MED ORDER — CHLORHEXIDINE GLUCONATE CLOTH 2 % EX PADS: 6.0000 | MEDICATED_PAD | Freq: Every day | CUTANEOUS | Status: DC

## 2021-07-17 MED ORDER — SODIUM CHLORIDE 0.9% IV SOLUTION: Freq: Once | INTRAVENOUS | Status: AC

## 2021-07-17 MED ORDER — HEPARIN SODIUM (PORCINE) 1000 UNIT/ML DIALYSIS
1000.0000 [IU] | INTRAMUSCULAR | Status: DC | PRN
  Filled 2021-07-17 (×2): qty 1

## 2021-07-17 MED ORDER — HYDRALAZINE HCL 20 MG/ML IJ SOLN
5.0000 mg | Freq: Four times a day (QID) | INTRAMUSCULAR | Status: DC | PRN
  Administered 2021-07-17 – 2021-07-18 (×3): 5 mg via INTRAVENOUS
  Filled 2021-07-17 (×3): qty 1

## 2021-07-17 NOTE — ED Notes (Signed)
Echo in process 

## 2021-07-17 NOTE — Consult Note (Signed)
[facility] Consultation Note  Requesting MD: Dr. Roel Cluck, A. Reason for consult: AKI on CKD  HPI:  Marcus [name] is a 66 y.o. male with history of hypertension >40 years, diabetes, hyperlipidemia, brought in by [facility] from [place] for elevated serum creatinine level and significant weight gain since 04/2021, seen as a consultation for the evaluation and management of AKI on CKD. Per patient he was never seen by nephrologist in the past and was mainly managed by his medical doctor in [place].  His serum creatinine level was elevated to 7.3 in 10/2020 and was continue to be elevated to around 13.8 on 05/09/2021.  Reportedly, at that time patient was advised to go to ER for the initiation of dialysis however he was reluctant to do so.  He started having symptoms of generalized weakness, fatigue and worsening lower extremity edema.  He went for general checkup and blood work when the creatinine level noted to be around 14 therefore he was sent to [facility] ER for further management. Patient said his blood pressure was usually elevated and used to have uncontrolled diabetes.  At home he is on carvedilol, glipizide, losartan, hydrochlorothiazide and potassium chloride. In the ER, the blood pressure was elevated, in room air.  Labs showed creatinine level >18, BUN 129, potassium 5.5, hemoglobin level 7.  He received IV diuretics without much improvement in urine output.  The chest x-ray with cardiomegaly, mild vascular congestion and pleural effusion consistent with congestive heart failure.  The kidney ultrasound with slightly echogenic cortex and medical renal disease without any obstruction.  Currently patient reports that he feels tired but denies headache, dysgeusia, nausea, vomiting, chest pain or shortness of breath.  Reportedly some dyspnea on exertion and concerned about significant weight gain recently >25 lb.  Denies any change in urinary  habit.  His wife is present at the bedside.  Creatinine, Ser  Date/Time Value Ref Range Status  07/17/2021 03:19 AM 16.10 (H) 0.61 - 1.24 mg/dL Final  07/16/2021 11:51 PM 17.30 (H) 0.61 - 1.24 mg/dL Final  07/16/2021 11:47 PM 16.04 (H) 0.61 - 1.24 mg/dL Final  07/16/2021 01:14 PM >18.00 (H) 0.61 - 1.24 mg/dL Final  07/16/2021 01:03 PM 16.58 (H) 0.61 - 1.24 mg/dL Final  01/10/2021 03:03 PM 8.76 (H) 0.61 - 1.24 mg/dL Final  10/18/2020 08:43 PM 7.31 (H) 0.61 - 1.24 mg/dL Final     PMHx:   Past Medical History:  Diagnosis Date   Diabetes mellitus without complication ([place])    Hypertension     Past Surgical History:  Procedure Laterality Date   HERNIA REPAIR     ROTATOR CUFF REPAIR Right     Family Hx:  Family History  Problem Relation Age of Onset   CAD Mother    Hypertension Sister    Renal Disease Sister     Social History:  reports that he has never smoked. He has never used smokeless tobacco. He reports that he does not drink alcohol and does not use drugs.  Allergies:  Allergies  Allergen Reactions   Amlodipine Swelling and Other (See Comments)    Swelling of lower limbs    Medications: Prior to Admission medications   Medication Sig Start Date End Date Taking? Authorizing Provider  acetaminophen (TYLENOL) 325 MG tablet Take 650 mg by mouth every 6 (six) hours as needed for moderate pain or headache.   Yes [provider]  albuterol (VENTOLIN HFA) 108 (90 Base) MCG/ACT inhaler Inhale  1-2 puffs into the lungs every 6 (six) hours as needed for wheezing or shortness of breath. 10/18/20  Yes Raspet, Erin K, PA-C  atorvastatin (LIPITOR) 20 MG tablet Take 10 mg by mouth at bedtime. 06/29/20  Yes [provider]  B Complex Vitamins (B COMPLEX-B12) TABS Take 2 tablets by mouth daily.   Yes [provider]  budesonide-formoterol (SYMBICORT) 160-4.5 MCG/ACT inhaler Inhale 2 puffs into the lungs in the morning and at bedtime. 10/18/20  Yes Raspet,  Erin K, PA-C  carvedilol (COREG) 25 MG tablet Take 25 mg by mouth 2 (two) times daily with a meal. 03/22/20  Yes [provider]  glipiZIDE (GLUCOTROL) 5 MG tablet Take 5 mg by mouth 2 (two) times daily before a meal.   Yes [provider]  losartan (COZAAR) 100 MG tablet Take 100 mg by mouth at bedtime. 07/16/20  Yes [provider]  doxycycline (VIBRAMYCIN) 100 MG capsule Take 1 capsule (100 mg total) by mouth 2 (two) times daily. Patient not taking: Reported on 07/16/2021 10/18/20   Raspet, Junie Panning K, PA-C  hydrochlorothiazide (HYDRODIURIL) 12.5 MG tablet Take by mouth. Patient not taking: Reported on 07/16/2021    [provider]  potassium chloride (KLOR-CON) 10 MEQ tablet Take by mouth. Patient not taking: Reported on 07/16/2021    [provider]    I have reviewed the patient's current medications.  Labs:  Results for orders placed or performed during the hospital encounter of 07/16/21 (from the past 48 hour(s))  Brain natriuretic peptide     Status: Abnormal   Collection Time: 07/16/21  1:03 PM  Result Value Ref Range   B Natriuretic Peptide 344.1 (H) 0.0 - 100.0 pg/mL    Comment: Performed at [facility] [address], [place], [place] [address]  Comprehensive metabolic panel     Status: Abnormal   Collection Time: 07/16/21  1:03 PM  Result Value Ref Range   Sodium 141 135 - 145 mmol/L   Potassium 5.5 (H) 3.5 - 5.1 mmol/L   Chloride 113 (H) 98 - 111 mmol/L   CO2 16 (L) 22 - 32 mmol/L   Glucose, Bld 70 70 - 99 mg/dL    Comment: Glucose reference range applies only to samples taken after fasting for at least 8 hours.   BUN 115 (H) 8 - 23 mg/dL   Creatinine, Ser 16.58 (H) 0.61 - 1.24 mg/dL   Calcium 7.4 (L) 8.9 - 10.3 mg/dL   Total Protein 6.4 (L) 6.5 - 8.1 g/dL   Albumin 3.0 (L) 3.5 - 5.0 g/dL   AST 14 (L) 15 - 41 U/L   ALT 14 0 - 44 U/L   Alkaline Phosphatase 48 38 - 126 U/L   Total Bilirubin 0.3 0.3 - 1.2 mg/dL   GFR, Estimated  3 (L) >60 mL/min    Comment: (NOTE) Calculated using the CKD-EPI Creatinine Equation (2021)    Anion gap 12 5 - 15    Comment: Performed at [facility], [place] [address] [place], [place] [address]  CBC with Differential     Status: Abnormal   Collection Time: 07/16/21  1:03 PM  Result Value Ref Range   WBC 6.3 4.0 - 10.5 K/uL   RBC 2.51 (L) 4.22 - 5.81 MIL/uL   Hemoglobin 7.9 (L) 13.0 - 17.0 g/dL   HCT 24.4 (L) 39.0 - 52.0 %   MCV 97.2 80.0 - 100.0 fL   MCH 31.5 26.0 - 34.0 pg  MCHC 32.4 30.0 - 36.0 g/dL   RDW 17.2 (H) 11.5 - 15.5 %   Platelets 188 150 - 400 K/uL   nRBC 0.0 0.0 - 0.2 %   Neutrophils Relative % 65 %   Neutro Abs 4.2 1.7 - 7.7 K/uL   Lymphocytes Relative 15 %   Lymphs Abs 0.9 0.7 - 4.0 K/uL   Monocytes Relative 10 %   Monocytes Absolute 0.6 0.1 - 1.0 K/uL   Eosinophils Relative 8 %   Eosinophils Absolute 0.5 0.0 - 0.5 K/uL   Basophils Relative 1 %   Basophils Absolute 0.0 0.0 - 0.1 K/uL   Immature Granulocytes 1 %   Abs Immature Granulocytes 0.03 0.00 - 0.07 K/uL    Comment: Performed at [facility] [address], [place], [place] [address]  I-stat chem 8, ED (not at [facility] or [facility])     Status: Abnormal   Collection Time: 07/16/21  1:14 PM  Result Value Ref Range   Sodium 144 135 - 145 mmol/L   Potassium 5.5 (H) 3.5 - 5.1 mmol/L   Chloride 113 (H) 98 - 111 mmol/L   BUN 129 (H) 8 - 23 mg/dL   Creatinine, Ser >18.00 (H) 0.61 - 1.24 mg/dL   Glucose, Bld 65 (L) 70 - 99 mg/dL    Comment: Glucose reference range applies only to samples taken after fasting for at least 8 hours.   Calcium, Ion 1.01 (L) 1.15 - 1.40 mmol/L   TCO2 18 (L) 22 - 32 mmol/L   Hemoglobin 8.2 (L) 13.0 - 17.0 g/dL   HCT 24.0 (L) 39.0 - 52.0 %  Resp Panel by RT-PCR (Flu A&B, Covid) Nasopharyngeal Swab     Status: None   Collection Time: 07/16/21  9:41 PM   Specimen: Nasopharyngeal Swab; Nasopharyngeal(NP) swabs in vial transport medium  Result Value Ref Range   SARS  Coronavirus 2 by RT PCR NEGATIVE NEGATIVE    Comment: (NOTE) SARS-CoV-2 target nucleic acids are NOT DETECTED.  The SARS-CoV-2 RNA is generally detectable in upper respiratory specimens during the acute phase of infection. The lowest concentration of SARS-CoV-2 viral copies this assay can detect is 138 copies/mL. A negative result does not preclude SARS-Cov-2 infection and should not be used as the sole basis for treatment or other patient management decisions. A negative result may occur with  improper specimen collection/handling, submission of specimen other than nasopharyngeal swab, presence of viral mutation(s) within the areas targeted by this assay, and inadequate number of viral copies(<138 copies/mL). A negative result must be combined with clinical observations, patient history, and epidemiological information. The expected result is Negative.  Fact Sheet for Patients:  EntrepreneurPulse.com.au  Fact Sheet for Healthcare Providers:  IncredibleEmployment.be  This test is no t yet approved or cleared by the [place] FDA and  has been authorized for detection and/or diagnosis of SARS-CoV-2 by FDA under an Emergency Use Authorization (EUA). This EUA will remain  in effect (meaning this test can be used) for the duration of the COVID-19 declaration under Section 564(b)(1) of the Act, 21 U.S.C.section 360bbb-3(b)(1), unless the authorization is terminated  or revoked sooner.       Influenza A by PCR NEGATIVE NEGATIVE   Influenza B by PCR NEGATIVE NEGATIVE    Comment: (NOTE) The Xpert Xpress SARS-CoV-2/FLU/RSV plus assay is intended as an aid in the diagnosis of influenza from Nasopharyngeal swab specimens and should not be used as a sole basis for treatment. Nasal washings and aspirates are  unacceptable for Xpert Xpress SARS-CoV-2/FLU/RSV testing.  Fact Sheet for Patients: EntrepreneurPulse.com.au  Fact Sheet  for Healthcare Providers: IncredibleEmployment.be  This test is not yet approved or cleared by the [place] FDA and has been authorized for detection and/or diagnosis of SARS-CoV-2 by FDA under an Emergency Use Authorization (EUA). This EUA will remain in effect (meaning this test can be used) for the duration of the COVID-19 declaration under Section 564(b)(1) of the Act, 21 U.S.C. section 360bbb-3(b)(1), unless the authorization is terminated or revoked.  Performed at [facility], [place] [address], [place], [place] [address]   Hemoglobin A1c     Status: None   Collection Time: 07/16/21 11:47 PM  Result Value Ref Range   Hgb A1c MFr Bld 5.0 4.8 - 5.6 %    Comment: (NOTE) Pre diabetes:          5.7%-6.4%  Diabetes:              >6.4%  Glycemic control for   <7.0% adults with diabetes    Mean Plasma Glucose 96.8 mg/dL    Comment: Performed at [facility] [address] [place], [place] [address]  Vitamin B12     Status: None   Collection Time: 07/16/21 11:47 PM  Result Value Ref Range   Vitamin B-12 395 180 - 914 pg/mL    Comment: (NOTE) This assay is not validated for testing neonatal or myeloproliferative syndrome specimens for Vitamin B12 levels. Performed at [facility], [place] [address], [place], [place] [address]   Folate     Status: None   Collection Time: 07/16/21 11:47 PM  Result Value Ref Range   Folate 7.6 >5.9 ng/mL    Comment: Performed at [facility], [place] [address] [place], [place] [address]  Iron and TIBC     Status: Abnormal   Collection Time: 07/16/21 11:47 PM  Result Value Ref Range   Iron 64 45 - 182 ug/dL   TIBC 204 (L) 250 - 450 ug/dL   Saturation Ratios 31 17.9 - 39.5 %   UIBC 140 ug/dL    Comment: Performed at [facility] [address], [place], [place] [address]  Ferritin     Status: None   Collection Time: 07/16/21 11:47 PM  Result Value Ref Range   Ferritin 222 24 - 336 ng/mL     Comment: Performed at [facility] [address], [place], [place] [address]  Reticulocytes     Status: Abnormal   Collection Time: 07/16/21 11:47 PM  Result Value Ref Range   Retic Ct Pct 6.9 (H) 0.4 - 3.1 %   RBC. 2.29 (L) 4.22 - 5.81 MIL/uL   Retic Count, Absolute 158.0 19.0 - 186.0 K/uL   Immature Retic Fract 12.7 2.3 - 15.9 %    Comment: Performed at [facility] [address], [place], [place] [address]  CK     Status: Abnormal   Collection Time: 07/16/21 11:47 PM  Result Value Ref Range   Total CK 669 (H) 49 - 397 U/L    Comment: Performed at [facility], [place] [address] [place],  [address]  Hepatic function panel     Status: Abnormal   Collection Time: 07/16/21 11:47 PM  Result Value Ref Range   Total Protein 6.0 (L) 6.5 - 8.1 g/dL   Albumin 2.8 (L) 3.5 - 5.0 g/dL   AST 10 (L) 15 - 41  U/L   ALT 15 0 - 44 U/L   Alkaline Phosphatase 43 38 - 126 U/L   Total Bilirubin 0.3 0.3 - 1.2 mg/dL   Bilirubin, Direct <0.1 0.0 - 0.2 mg/dL   Indirect Bilirubin NOT CALCULATED 0.3 - 0.9 mg/dL    Comment: Performed at [facility] [address], [place], [place] [address]  Magnesium     Status: Abnormal   Collection Time: 07/16/21 11:47 PM  Result Value Ref Range   Magnesium 1.6 (L) 1.7 - 2.4 mg/dL    Comment: Performed at [facility] [address] [place], [place] [address]  Phosphorus     Status: Abnormal   Collection Time: 07/16/21 11:47 PM  Result Value Ref Range   Phosphorus 7.5 (H) 2.5 - 4.6 mg/dL    Comment: Performed at [facility] [address] [place], [place] [address]  TSH     Status: None   Collection Time: 07/16/21 11:47 PM  Result Value Ref Range   TSH 2.567 0.350 - 4.500 uIU/mL    Comment: Performed by a 3rd Generation assay with a functional sensitivity of <=0.01 uIU/mL. Performed at [facility], [place] [address] [place], [place] [address]   Troponin I (High Sensitivity)     Status: Abnormal   Collection  Time: 07/16/21 11:47 PM  Result Value Ref Range   Troponin I (High Sensitivity) 36 (H) <18 ng/L    Comment: (NOTE) Elevated high sensitivity troponin I (hsTnI) values and significant  changes across serial measurements may suggest ACS but many other  chronic and acute conditions are known to elevate hsTnI results.  Refer to the Links section for chest pain algorithms and additional  guidance. Performed at [facility], [place] [address] [place], [place] [address]   Basic metabolic panel     Status: Abnormal   Collection Time: 07/16/21 11:47 PM  Result Value Ref Range   Sodium 142 135 - 145 mmol/L   Potassium 4.9 3.5 - 5.1 mmol/L   Chloride 114 (H) 98 - 111 mmol/L   CO2 16 (L) 22 - 32 mmol/L   Glucose, Bld 147 (H) 70 - 99 mg/dL    Comment: Glucose reference range applies only to samples taken after fasting for at least 8 hours.   BUN 112 (H) 8 - 23 mg/dL   Creatinine, Ser 16.04 (H) 0.61 - 1.24 mg/dL   Calcium 7.1 (L) 8.9 - 10.3 mg/dL   GFR, Estimated 3 (L) >60 mL/min    Comment: (NOTE) Calculated using the CKD-EPI Creatinine Equation (2021)    Anion gap 12 5 - 15    Comment: Performed at [facility] [address], [place], [place] [address]  CBC with Differential/Platelet     Status: Abnormal   Collection Time: 07/16/21 11:47 PM  Result Value Ref Range   WBC 6.1 4.0 - 10.5 K/uL   RBC 2.26 (L) 4.22 - 5.81 MIL/uL   Hemoglobin 7.0 (L) 13.0 - 17.0 g/dL    Comment: REPEATED TO VERIFY   HCT 22.3 (L) 39.0 - 52.0 %   MCV 98.7 80.0 - 100.0 fL   MCH 31.0 26.0 - 34.0 pg   MCHC 31.4 30.0 - 36.0 g/dL   RDW 17.5 (H) 11.5 - 15.5 %   Platelets 181 150 - 400 K/uL   nRBC 0.0 0.0 - 0.2 %   Neutrophils Relative % 70 %   Neutro Abs 4.3 1.7 - 7.7 K/uL  Lymphocytes Relative 12 %   Lymphs Abs 0.7 0.7 - 4.0 K/uL   Monocytes Relative 9 %   Monocytes Absolute 0.6 0.1 - 1.0 K/uL   Eosinophils Relative 7 %   Eosinophils Absolute 0.4 0.0 - 0.5 K/uL   Basophils Relative 1 %    Basophils Absolute 0.0 0.0 - 0.1 K/uL   Immature Granulocytes 1 %   Abs Immature Granulocytes 0.04 0.00 - 0.07 K/uL    Comment: Performed at [facility] [address], [place], [place] [address]  ABO/Rh     Status: None   Collection Time: 07/16/21 11:47 PM  Result Value Ref Range   ABO/RH(D)      O POS Performed at [facility] [address] [place], [place] [address]   I-stat chem 8, ED (not at [facility] or [facility])     Status: Abnormal   Collection Time: 07/16/21 11:51 PM  Result Value Ref Range   Sodium 143 135 - 145 mmol/L   Potassium 4.9 3.5 - 5.1 mmol/L   Chloride 112 (H) 98 - 111 mmol/L   BUN >130 (H) 8 - 23 mg/dL   Creatinine, Ser 17.30 (H) 0.61 - 1.24 mg/dL   Glucose, Bld 139 (H) 70 - 99 mg/dL    Comment: Glucose reference range applies only to samples taken after fasting for at least 8 hours.   Calcium, Ion 1.00 (L) 1.15 - 1.40 mmol/L   TCO2 18 (L) 22 - 32 mmol/L   Hemoglobin 7.5 (L) 13.0 - 17.0 g/dL   HCT 22.0 (L) 39.0 - 52.0 %  Urinalysis, Routine w reflex microscopic     Status: Abnormal   Collection Time: 07/16/21 11:52 PM  Result Value Ref Range   Color, Urine STRAW (A) YELLOW   APPearance CLEAR CLEAR   Specific Gravity, Urine 1.010 1.005 - 1.030   pH 5.0 5.0 - 8.0   Glucose, UA 150 (A) NEGATIVE mg/dL   Hgb urine dipstick MODERATE (A) NEGATIVE   Bilirubin Urine NEGATIVE NEGATIVE   Ketones, ur NEGATIVE NEGATIVE mg/dL   Protein, ur >=300 (A) NEGATIVE mg/dL   Nitrite NEGATIVE NEGATIVE   Leukocytes,Ua NEGATIVE NEGATIVE   RBC / HPF 0-5 0 - 5 RBC/hpf   WBC, UA 0-5 0 - 5 WBC/hpf   Bacteria, UA NONE SEEN NONE SEEN   Squamous Epithelial / LPF 0-5 0 - 5   Mucus PRESENT     Comment: Performed at [facility], [address] [place], [place] [address]  Osmolality, urine     Status: None   Collection Time: 07/16/21 11:52 PM  Result Value Ref Range   Osmolality, Ur 315 300 - 900 mOsm/kg    Comment: Performed at [facility] [address], [place], [place] [address]  Sodium, urine, random     Status: None   Collection Time: 07/16/21 11:52 PM  Result Value Ref Range   Sodium, Ur 67 mmol/L    Comment: Performed at [facility] [address] [place], [place] [address]  Creatinine, urine, random     Status: None   Collection Time: 07/16/21 11:52 PM  Result Value Ref Range   Creatinine, Urine 64.10 mg/dL    Comment: Performed at [facility] [address], [place], [place] [address]  Prepare RBC (crossmatch)     Status: None   Collection Time: 07/17/21  1:00 AM  Result Value Ref Range   Order Confirmation  ORDER PROCESSED BY BLOOD BANK Performed at [facility], [place] [address], [place], [place] [address]   Magnesium     Status: Abnormal   Collection Time: 07/17/21  3:19 AM  Result Value Ref Range   Magnesium 1.6 (L) 1.7 - 2.4 mg/dL    Comment: Performed at [facility] [address] [place], [place] [address]  Phosphorus     Status: Abnormal   Collection Time: 07/17/21  3:19 AM  Result Value Ref Range   Phosphorus 7.8 (H) 2.5 - 4.6 mg/dL    Comment: Performed at [facility] [address] [name], [place] [address]  CBC WITH DIFFERENTIAL     Status: Abnormal   Collection Time: 07/17/21  3:19 AM  Result Value Ref Range   WBC 5.8 4.0 - 10.5 K/uL   RBC 2.27 (L) 4.22 - 5.81 MIL/uL   Hemoglobin 7.1 (L) 13.0 - 17.0 g/dL    Comment: REPEATED TO VERIFY   HCT 22.7 (L) 39.0 - 52.0 %   MCV 100.0 80.0 - 100.0 fL   MCH 31.3 26.0 - 34.0 pg   MCHC 31.3 30.0 - 36.0 g/dL   RDW 17.5 (H) 11.5 - 15.5 %   Platelets 183 150 - 400 K/uL   nRBC 0.0 0.0 - 0.2 %   Neutrophils Relative % 66 %   Neutro Abs 3.9 1.7 - 7.7 K/uL   Lymphocytes Relative 14 %   Lymphs Abs 0.8 0.7 - 4.0 K/uL   Monocytes Relative 10 %   Monocytes Absolute 0.6 0.1 - 1.0 K/uL   Eosinophils Relative 8 %   Eosinophils Absolute 0.4 0.0 - 0.5 K/uL   Basophils Relative 1 %   Basophils Absolute 0.0 0.0 - 0.1 K/uL   Immature  Granulocytes 1 %   Abs Immature Granulocytes 0.04 0.00 - 0.07 K/uL    Comment: Performed at [facility] [address] [place], [place] [address]  TSH     Status: None   Collection Time: 07/17/21  3:19 AM  Result Value Ref Range   TSH 2.383 0.350 - 4.500 uIU/mL    Comment: Performed by a 3rd Generation assay with a functional sensitivity of <=0.01 uIU/mL. Performed at [facility], [place] [address], [place], [place] [address]   Comprehensive metabolic panel     Status: Abnormal   Collection Time: 07/17/21  3:19 AM  Result Value Ref Range   Sodium 143 135 - 145 mmol/L   Potassium 4.7 3.5 - 5.1 mmol/L   Chloride 110 98 - 111 mmol/L   CO2 16 (L) 22 - 32 mmol/L   Glucose, Bld 141 (H) 70 - 99 mg/dL    Comment: Glucose reference range applies only to samples taken after fasting for at least 8 hours.   BUN 111 (H) 8 - 23 mg/dL   Creatinine, Ser 16.10 (H) 0.61 - 1.24 mg/dL   Calcium 7.2 (L) 8.9 - 10.3 mg/dL   Total Protein 6.1 (L) 6.5 - 8.1 g/dL   Albumin 2.8 (L) 3.5 - 5.0 g/dL   AST 12 (L) 15 - 41 U/L   ALT 15 0 - 44 U/L   Alkaline Phosphatase 43 38 - 126 U/L   Total Bilirubin 0.2 (L) 0.3 - 1.2 mg/dL   GFR, Estimated 3 (L) >60 mL/min    Comment: (NOTE) Calculated using the CKD-EPI Creatinine Equation (2021)    Anion gap 17 (H) 5 - 15    Comment: Performed  at [facility], [place] [address] [place], [place] [address]  Type and screen [place]     Status: None (Preliminary result)   Collection Time: 07/17/21  3:30 AM  Result Value Ref Range   ABO/RH(D) O POS    Antibody Screen NEG    Sample Expiration 07/20/2021,2359    Unit Number [id number]    Blood Component Type RED CELLS,LR    Unit division 00    Status of Unit ISSUED    Transfusion Status OK TO TRANSFUSE    Crossmatch Result      Compatible Performed at [facility], [place] [address] [place], [place] [address]   POC CBG, ED     Status: Abnormal   Collection Time: 07/17/21   3:36 AM  Result Value Ref Range   Glucose-Capillary 144 (H) 70 - 99 mg/dL    Comment: Glucose reference range applies only to samples taken after fasting for at least 8 hours.  I-Stat venous blood gas, ED     Status: Abnormal   Collection Time: 07/17/21  4:08 AM  Result Value Ref Range   pH, Ven 7.260 7.250 - 7.430   pCO2, Ven 37.7 (L) 44.0 - 60.0 mmHg   pO2, Ven 166.0 (H) 32.0 - 45.0 mmHg   Bicarbonate 16.9 (L) 20.0 - 28.0 mmol/L   TCO2 18 (L) 22 - 32 mmol/L   O2 Saturation 99.0 %   Acid-base deficit 9.0 (H) 0.0 - 2.0 mmol/L   Sodium 143 135 - 145 mmol/L   Potassium 4.7 3.5 - 5.1 mmol/L   Calcium, Ion 0.98 (L) 1.15 - 1.40 mmol/L   HCT 21.0 (L) 39.0 - 52.0 %   Hemoglobin 7.1 (L) 13.0 - 17.0 g/dL   Sample type VENOUS      ROS:  Pertinent items noted in HPI and remainder of comprehensive ROS otherwise negative.  Physical Exam: Vitals:   07/17/21 0614 07/17/21 0630  BP: (!) 177/86 (!) 178/83  Pulse: 69 69  Resp: 20 11  Temp: 98.3 F (36.8 C)   SpO2: 100% 100%     General exam: Appears calm and comfortable  Respiratory system: Distant breath sound. Respiratory effort normal. No wheezing or crackle Cardiovascular system: S1 & S2 heard, RRR.  Lower extremity pitting edema present++ Gastrointestinal system: Abdomen is distended, soft and nontender. Normal bowel sounds heard. Central nervous system: Alert and oriented. No focal neurological deficits. Extremities: Symmetric 5 x 5 power. Skin: No rashes, lesions or ulcers Psychiatry: Judgement and insight appear normal. Mood & affect appropriate.   Assessment/Plan:  #New ESRD associated with volume overload and uremic symptoms: Likely due to hypertension and diabetes.  UA with proteinuria probably due to diabetes without sediment.  Kidney ultrasound with chronic finding with no obstruction.  Patient received IV diuretics in the ER without much response.  Discussed about starting dialysis after placement of tunneled HD catheter  and need for permanent access etc. with the patient and his wife.  They agreed with the plan. Consult IR to place tunneled HD catheter First HD ordered today and serial HD for 3 days if schedule permits Ordered hepatitis labs needed before dialysis. He will need vascular consult for placement of permanent access, I will order vein mapping. I will inform social worker to arrange outpatient dialysis.  #Anion gap metabolic acidosis due to renal failure: Expect to improve with dialysis.  Monitor CO2 level.  #Anemia of ESRD: Iron saturation 31% and ferritin 222.  I will  order Aranesp with dialysis.  Monitor hemoglobin and transfuse as needed.  #CKD-MBD: Check PTH level.  Monitor calcium phosphorus level.  #Hypertension/volume overload: Recommend to hold losartan.  Ultrafiltration during HD and agree with continuing IV diuretics for now as patient still makes urine.  Monitor blood pressure.  Thank you for the consult.  We will follow with you.  Leatta Alewine Tanna Furry 07/17/2021, 6:40 AM  [employer].

## 2021-07-17 NOTE — ED Notes (Signed)
Breakfast Orders Placed °

## 2021-07-17 NOTE — ED Notes (Signed)
Called [facility] requesting for purple man, [facility]W "advised the charge nurse has to call patient placement to see if they are even able to take the pt."

## 2021-07-17 NOTE — ED Notes (Signed)
Pt transported to IR 

## 2021-07-17 NOTE — Consult Note (Signed)
Chief Complaint: Patient was seen in consultation today for tunneled dialysis catheter   Referring Physician(s): Dr. Carolin Sicks   Supervising Physician: Corrie Mckusick  Patient Status: [name] - ED  History of Present Illness: Marcus [name] is a 66 y.o. male with a medical history significant for HTN, DM and elevated creatinine levels for almost a year. He had been advised to seek further medical treatment/dialysis last December but the patient did not follow through with this.   He presented to the ED 07/16/21 for generalized weakness, fatigue and worsening lower extremity edema. Creatinine level on admission was >18. He has been seen by nephrology and will begin hemodialysis as soon as possible.   Interventional Radiology has been asked to evaluate this patient for an image-guided tunneled catheter to facilitate his hemodialysis treatment goals.   Past Medical History:  Diagnosis Date   Diabetes mellitus without complication ([place])    Hypertension     Past Surgical History:  Procedure Laterality Date   HERNIA REPAIR     ROTATOR CUFF REPAIR Right     Allergies: Amlodipine  Medications: Prior to Admission medications   Medication Sig Start Date End Date Taking? Authorizing Provider  acetaminophen (TYLENOL) 325 MG tablet Take 650 mg by mouth every 6 (six) hours as needed for moderate pain or headache.   Yes [provider]  albuterol (VENTOLIN HFA) 108 (90 Base) MCG/ACT inhaler Inhale 1-2 puffs into the lungs every 6 (six) hours as needed for wheezing or shortness of breath. 10/18/20  Yes Raspet, Erin K, PA-C  atorvastatin (LIPITOR) 20 MG tablet Take 10 mg by mouth at bedtime. 06/29/20  Yes [provider]  B Complex Vitamins (B COMPLEX-B12) TABS Take 2 tablets by mouth daily.   Yes [provider]  budesonide-formoterol (SYMBICORT) 160-4.5 MCG/ACT inhaler Inhale 2 puffs into the lungs in the morning and at bedtime. 10/18/20  Yes Raspet, Erin  K, PA-C  carvedilol (COREG) 25 MG tablet Take 25 mg by mouth 2 (two) times daily with a meal. 03/22/20  Yes [provider]  glipiZIDE (GLUCOTROL) 5 MG tablet Take 5 mg by mouth 2 (two) times daily before a meal.   Yes [provider]  losartan (COZAAR) 100 MG tablet Take 100 mg by mouth at bedtime. 07/16/20  Yes [provider]  doxycycline (VIBRAMYCIN) 100 MG capsule Take 1 capsule (100 mg total) by mouth 2 (two) times daily. Patient not taking: Reported on 07/16/2021 10/18/20   Raspet, Junie Panning K, PA-C  hydrochlorothiazide (HYDRODIURIL) 12.5 MG tablet Take by mouth. Patient not taking: Reported on 07/16/2021    [provider]  potassium chloride (KLOR-CON) 10 MEQ tablet Take by mouth. Patient not taking: Reported on 07/16/2021    [provider]     Family History  Problem Relation Age of Onset   CAD Mother    Hypertension Sister    Renal Disease Sister     Social History   Socioeconomic History   Marital status: Married    Spouse name: Not on file   Number of children: Not on file   Years of education: Not on file   Highest education level: Not on file  Occupational History   Not on file  Tobacco Use   Smoking status: Never   Smokeless tobacco: Never  Vaping Use   Vaping Use: Never used  Substance and Sexual Activity   Alcohol use: Never   Drug use: Never   Sexual activity: Not on file  Other  Topics Concern   Not on file  Social History Narrative   Not on file   Social Determinants of Health   Financial Resource Strain: Not on file  Food Insecurity: Not on file  Transportation Needs: Not on file  Physical Activity: Not on file  Stress: Not on file  Social Connections: Not on file    Review of Systems: A 12 point ROS discussed and pertinent positives are indicated in the HPI above.  All other systems are negative.  Review of Systems  Constitutional:  Positive for fatigue.  Respiratory:  Positive for shortness of breath.  Negative for cough.   Cardiovascular:  Positive for leg swelling. Negative for chest pain.  Gastrointestinal:  Negative for abdominal pain, diarrhea, nausea and vomiting.  Neurological:  Positive for weakness. Negative for headaches.  Psychiatric/Behavioral:  Positive for decreased concentration.    Vital Signs: BP (!) 175/86    Pulse 70    Temp 98.3 F (36.8 C)    Resp (!) 21    SpO2 100%   Physical Exam Constitutional:      General: He is not in acute distress.    Appearance: He is obese.  HENT:     Mouth/Throat:     Mouth: Mucous membranes are moist.     Pharynx: Oropharynx is clear.  Cardiovascular:     Rate and Rhythm: Normal rate and regular rhythm.     Pulses: Normal pulses.     Heart sounds: Normal heart sounds.  Pulmonary:     Effort: Pulmonary effort is normal.     Breath sounds: Normal breath sounds.  Abdominal:     General: Bowel sounds are normal.     Palpations: Abdomen is soft.  Musculoskeletal:     Right lower leg: Edema present.     Left lower leg: Edema present.  Skin:    General: Skin is warm and dry.  Neurological:     Mental Status: He is alert and oriented to person, place, and time.     Comments: Some forgetfulness; repetitious questions. Admits to mental fogginess    Imaging: DG Chest 2 View  Result Date: 07/16/2021 CLINICAL DATA:  Fluid overload. 27 pound weight gain. Elevated creatinine. EXAM: CHEST - 2 VIEW COMPARISON:  None. FINDINGS: The heart is enlarged. Mild vascular congestion is present. Small effusions are present. Ill-defined scratched at minimal airspace opacity is present at both lung bases. IMPRESSION: Cardiomegaly with mild vascular congestion and small bilateral pleural effusions compatible with congestive heart failure. Electronically Signed   By: San Morelle BlanchardD.   On: 07/16/2021 13:12   US RENAL  Result Date: 07/16/2021 CLINICAL DATA:  Acute kidney injury EXAM: RENAL / URINARY TRACT ULTRASOUND COMPLETE COMPARISON:  None.  FINDINGS: Right Kidney: Renal measurements: 9.3 x 3.7 x 4.4 cm = volume: 78.8 mL. Cortex is slightly echogenic. No mass or hydronephrosis Left Kidney: Renal measurements: 9.3 x 5.5 x 4.7 cm = volume: 126.4 mL. Cortex is slightly echogenic. No mass or hydronephrosis Bladder: Appears normal for degree of bladder distention. Other: None. IMPRESSION: Slightly echogenic cortex consistent with medical renal disease. No hydronephrosis Electronically Signed   By: Donavan Foil BlanchardD.   On: 07/16/2021 23:38    Labs:  CBC: Recent Labs    07/16/21 1303 07/16/21 1314 07/16/21 2347 07/16/21 2351 07/17/21 0319 07/17/21 0408 07/17/21 0728  WBC 6.3  --  6.1  --  5.8  --  6.0  HGB 7.9*   < > 7.0* 7.5* 7.1* 7.1*  8.1*  HCT 24.4*   < > 22.3* 22.0* 22.7* 21.0* 24.6*  PLT 188  --  181  --  183  --  190   < > = values in this interval not displayed.    COAGS: No results for input(s): INR, APTT in the last 8760 hours.  BMP: Recent Labs    07/16/21 1303 07/16/21 1314 07/16/21 2347 07/16/21 2351 07/17/21 0319 07/17/21 0408 07/17/21 0728  NA 141   < > 142 143 143 143 142  K 5.5*   < > 4.9 4.9 4.7 4.7 4.6  CL 113*   < > 114* 112* 110  --  111  CO2 16*  --  16*  --  16*  --  17*  GLUCOSE 70   < > 147* 139* 141*  --  98  BUN 115*   < > 112* >130* 111*  --  110*  CALCIUM 7.4*  --  7.1*  --  7.2*  --  7.2*  CREATININE 16.58*   < > 16.04* 17.30* 16.10*  --  16.12*  GFRNONAA 3*  --  3*  --  3*  --  3*   < > = values in this interval not displayed.    LIVER FUNCTION TESTS: Recent Labs    10/18/20 2043 07/16/21 1303 07/16/21 2347 07/17/21 0319 07/17/21 0728  BILITOT 0.5 0.3 0.3 0.2*  --   AST 12* 14* 10* 12*  --   ALT 11 14 15 15   --   ALKPHOS 78 48 43 43  --   PROT 7.0 6.4* 6.0* 6.1*  --   ALBUMIN 2.9* 3.0* 2.8* 2.8* 2.8*    TUMOR MARKERS: No results for input(s): AFPTM, CEA, CA199, CHROMGRNA in the last 8760 hours.  Assessment and Plan:  End Stage Renal Disease; Hemodialysis pending:  Marcus Archie M. 49, 66 year old male, is tentatively scheduled today for an image-guided tunneled dialysis catheter. The procedure was discussed with the patient and his wife who is at the bedside.   Risks and benefits discussed with the patient including, but not limited to bleeding, infection, vascular injury, pneumothorax which may require chest tube placement, air embolism or even death  All of the patient's questions were answered, patient is agreeable to proceed. He has been NPO.   Consent signed and in IR  Thank you for this interesting consult.  I greatly enjoyed meeting [place] and look forward to participating in their care.  A copy of this report was sent to the requesting provider on this date.  Electronically Signed: Soyla Dryer, AGACNP-BC [phone] 07/17/2021, 9:47 AM   I spent a total of 20 Minutes    in face to face in clinical consultation, greater than 50% of which was counseling/coordinating care for tunneled dialysis catheter

## 2021-07-17 NOTE — ED Notes (Signed)
Informed consent for blood transfusion complete

## 2021-07-17 NOTE — Progress Notes (Signed)
Progress Note    Marcus [name]  HAL:[id number] DOB: 1956/03/26  DOA: 07/16/2021 PCP: System, Provider Not In    Brief Narrative:     Medical records reviewed and are as summarized below:  Marcus [name] is a 66 y.o. male with medical history significant of chronic kidney disease stage V, hypertension hyperlipidemia diabetes type 2.  Admitted for  acute on chronic worsening renal function with evidence of fluid overload  Assessment/Plan:   Principal Problem:   AKI (acute kidney injury) ([place]) Active Problems:   Uncontrolled type 2 diabetes mellitus with hypoglycemia, without long-term current use of insulin (HCC)   Essential hypertension   Anemia   Obesity, Class III, BMI 40-49.9 (morbid obesity) ([place])   Cardiomegaly   Hyperkalemia   Uncontrolled type 2 diabetes mellitus with hypoglycemia, without long-term current use of insulin ([place])  - Order serial CBG.  Of note on regular labs noted to be hypoglycemic.  Will follow -D5 at low dose until able to take in PO   New ESRD associated with volume overload and uremic symptoms:  -due to hypertension and diabetes.  UA with proteinuria probably due to diabetes without sediment.  Kidney ultrasound with chronic finding with no obstruction.  Patient received IV diuretics in the ER without much response.   Consult IR to place tunneled HD catheter Per renal: First HD ordered 2/8 and serial HD for 3 days if schedule permits  Essential hypertension -resume home meds as able   Anemia S/p 1 unit PRBC per Dr. Roel Cluck   Obesity, Class III, BMI 40-49.9 (morbid obesity) ([place]) Estimated body mass index is 42.91 kg/m as calculated from the following:   Height as of 01/10/21: 5\' 7"  (1.702 m).   Weight as of 01/10/21: 124.3 kg.    Cardiomegaly Patient endorses orthopnea in the setting of fluid overload in the setting of severe chronic kidney disease   Hyperkalemia -lokelma x 1 -nephrology consult-- plan for HD  catheter placement today and HD     Family Communication/Anticipated D/C date and plan/Code Status    Code Status: Full Code.  Family Communication: wife at bedside Disposition Plan: Status is: Inpatient Remains inpatient appropriate because: needs HD             Medical Consultants:   renal    Subjective:   Hungry - upset about not being able to eat  Objective:    Vitals:   07/17/21 1100 07/17/21 1115 07/17/21 1130 07/17/21 1145  BP: (!) 186/87 (!) 184/80 (!) 177/83 (!) 186/84  Pulse: 71 71 71 75  Resp: 20 (!) 32 18 15  Temp:      TempSrc:      SpO2: 99% 99% 99% 97%    Intake/Output Summary (Last 24 hours) at 07/17/2021 1301 Last data filed at 07/17/2021 0651 Gross per 24 hour  Intake 325 ml  Output 300 ml  Net 25 ml   There were no vitals filed for this visit.  Exam:  General: Appearance:    Severely obese male in no acute distress     Lungs:     respirations unlabored  Heart:    Normal heart rate.    MS:   All extremities are intact.    Neurologic:   Awake, alert     Data Reviewed:   I have personally reviewed following labs and imaging studies:  Labs: Labs show the following:   Basic Metabolic Panel: Recent Labs  Lab 07/16/21 1303 07/16/21 1314  07/16/21 2347 07/16/21 2351 07/17/21 0319 07/17/21 0408 07/17/21 0728  NA 141 144 142 143 143 143 142  K 5.5* 5.5* 4.9 4.9 4.7 4.7 4.6  CL 113* 113* 114* 112* 110  --  111  CO2 16*  --  16*  --  16*  --  17*  GLUCOSE 70 65* 147* 139* 141*  --  98  BUN 115* 129* 112* >130* 111*  --  110*  CREATININE 16.58* >18.00* 16.04* 17.30* 16.10*  --  16.12*  CALCIUM 7.4*  --  7.1*  --  7.2*  --  7.2*  MG  --   --  1.6*  --  1.6*  --   --   PHOS  --   --  7.5*  --  7.8*  --  8.2*   GFR CrCl cannot be calculated (Unknown ideal weight.). Liver Function Tests: Recent Labs  Lab 07/16/21 1303 07/16/21 2347 07/17/21 0319 07/17/21 0728  AST 14* 10* 12*  --   ALT 14 15 15   --   ALKPHOS 48 43  43  --   BILITOT 0.3 0.3 0.2*  --   PROT 6.4* 6.0* 6.1*  --   ALBUMIN 3.0* 2.8* 2.8* 2.8*   No results for input(s): LIPASE, AMYLASE in the last 168 hours. No results for input(s): AMMONIA in the last 168 hours. Coagulation profile No results for input(s): INR, PROTIME in the last 168 hours.  CBC: Recent Labs  Lab 07/16/21 1303 07/16/21 1314 07/16/21 2347 07/16/21 2351 07/17/21 0319 07/17/21 0408 07/17/21 0728  WBC 6.3  --  6.1  --  5.8  --  6.0  NEUTROABS 4.2  --  4.3  --  3.9  --   --   HGB 7.9*   < > 7.0* 7.5* 7.1* 7.1* 8.1*  HCT 24.4*   < > 22.3* 22.0* 22.7* 21.0* 24.6*  MCV 97.2  --  98.7  --  100.0  --  93.9  PLT 188  --  181  --  183  --  190   < > = values in this interval not displayed.   Cardiac Enzymes: Recent Labs  Lab 07/16/21 2347  CKTOTAL 669*   BNP (last 3 results) No results for input(s): PROBNP in the last 8760 hours. CBG: Recent Labs  Lab 07/17/21 0336 07/17/21 0737 07/17/21 1001 07/17/21 1151  GLUCAP 144* 94 71 63*   D-Dimer: No results for input(s): DDIMER in the last 72 hours. Hgb A1c: Recent Labs    07/16/21 2347  HGBA1C 5.0   Lipid Profile: No results for input(s): CHOL, HDL, LDLCALC, TRIG, CHOLHDL, LDLDIRECT in the last 72 hours. Thyroid function studies: Recent Labs    07/17/21 0319  TSH 2.383   Anemia work up: Recent Labs    07/16/21 2347  VITAMINB12 395  FOLATE 7.6  FERRITIN 222  TIBC 204*  IRON 64  RETICCTPCT 6.9*   Sepsis Labs: Recent Labs  Lab 07/16/21 1303 07/16/21 2347 07/17/21 0319 07/17/21 0728  WBC 6.3 6.1 5.8 6.0    Microbiology Recent Results (from the past 240 hour(s))  Resp Panel by RT-PCR (Flu A&B, Covid) Nasopharyngeal Swab     Status: None   Collection Time: 07/16/21  9:41 PM   Specimen: Nasopharyngeal Swab; Nasopharyngeal(NP) swabs in vial transport medium  Result Value Ref Range Status   SARS Coronavirus 2 by RT PCR NEGATIVE NEGATIVE Final    Comment: (NOTE) SARS-CoV-2 target nucleic  acids are NOT DETECTED.  The SARS-CoV-2 RNA  is generally detectable in upper respiratory specimens during the acute phase of infection. The lowest concentration of SARS-CoV-2 viral copies this assay can detect is 138 copies/mL. A negative result does not preclude SARS-Cov-2 infection and should not be used as the sole basis for treatment or other patient management decisions. A negative result may occur with  improper specimen collection/handling, submission of specimen other than nasopharyngeal swab, presence of viral mutation(s) within the areas targeted by this assay, and inadequate number of viral copies(<138 copies/mL). A negative result must be combined with clinical observations, patient history, and epidemiological information. The expected result is Negative.  Fact Sheet for Patients:  EntrepreneurPulse.com.au  Fact Sheet for Healthcare Providers:  IncredibleEmployment.be  This test is no t yet approved or cleared by the [place] FDA and  has been authorized for detection and/or diagnosis of SARS-CoV-2 by FDA under an Emergency Use Authorization (EUA). This EUA will remain  in effect (meaning this test can be used) for the duration of the COVID-19 declaration under Section 564(b)(1) of the Act, 21 U.S.C.section 360bbb-3(b)(1), unless the authorization is terminated  or revoked sooner.       Influenza A by PCR NEGATIVE NEGATIVE Final   Influenza B by PCR NEGATIVE NEGATIVE Final    Comment: (NOTE) The Xpert Xpress SARS-CoV-2/FLU/RSV plus assay is intended as an aid in the diagnosis of influenza from Nasopharyngeal swab specimens and should not be used as a sole basis for treatment. Nasal washings and aspirates are unacceptable for Xpert Xpress SARS-CoV-2/FLU/RSV testing.  Fact Sheet for Patients: EntrepreneurPulse.com.au  Fact Sheet for Healthcare Providers: IncredibleEmployment.be  This  test is not yet approved or cleared by the [place] FDA and has been authorized for detection and/or diagnosis of SARS-CoV-2 by FDA under an Emergency Use Authorization (EUA). This EUA will remain in effect (meaning this test can be used) for the duration of the COVID-19 declaration under Section 564(b)(1) of the Act, 21 U.S.C. section 360bbb-3(b)(1), unless the authorization is terminated or revoked.  Performed at [facility], [place] [address], [place], [place] [address]     Procedures and diagnostic studies:  DG Chest 2 View  Result Date: 07/16/2021 CLINICAL DATA:  Fluid overload. 27 pound weight gain. Elevated creatinine. EXAM: CHEST - 2 VIEW COMPARISON:  None. FINDINGS: The heart is enlarged. Mild vascular congestion is present. Small effusions are present. Ill-defined scratched at minimal airspace opacity is present at both lung bases. IMPRESSION: Cardiomegaly with mild vascular congestion and small bilateral pleural effusions compatible with congestive heart failure. Electronically Signed   By: San Morelle M.D.   On: 07/16/2021 13:12   US RENAL  Result Date: 07/16/2021 CLINICAL DATA:  Acute kidney injury EXAM: RENAL / URINARY TRACT ULTRASOUND COMPLETE COMPARISON:  None. FINDINGS: Right Kidney: Renal measurements: 9.3 x 3.7 x 4.4 cm = volume: 78.8 mL. Cortex is slightly echogenic. No mass or hydronephrosis Left Kidney: Renal measurements: 9.3 x 5.5 x 4.7 cm = volume: 126.4 mL. Cortex is slightly echogenic. No mass or hydronephrosis Bladder: Appears normal for degree of bladder distention. Other: None. IMPRESSION: Slightly echogenic cortex consistent with medical renal disease. No hydronephrosis Electronically Signed   By: Donavan Foil M.D.   On: 07/16/2021 23:38    Medications:    atorvastatin  10 mg Oral QHS   carvedilol  25 mg Oral BID WC   Chlorhexidine Gluconate Cloth  6 each Topical Q0600   darbepoetin (ARANESP) injection - DIALYSIS  100 mcg Intravenous Q  Wed-HD  furosemide  80 mg Intravenous BID   insulin aspart  0-6 Units Subcutaneous Q4H   mometasone-formoterol  2 puff Inhalation BID   sodium chloride flush  3 mL Intravenous Q12H   Continuous Infusions:  sodium chloride     sodium chloride     sodium chloride      ceFAZolin (ANCEF) IV     dextrose 5 % and 0.45% NaCl 25 mL/hr at 07/17/21 1154     LOS: 1 day   Geradine Girt  [facility]   How to contact the [facility] Attending or Consulting provider [place] or covering provider during after hours [place], for this patient?  Check the care team in [facility] and look for a) attending/consulting [facility] provider listed and b) the [facility] team listed Log into [email or web address] and use [place]'s universal password to access. If you do not have the password, please contact the hospital operator. Locate the [facility] provider you are looking for under [facility] and page to a number that you can be directly reached. If you still have difficulty reaching the provider, please page the [facility] (Director on Call) for the Hospitalists listed on amion for assistance.  07/17/2021, 1:01 PM

## 2021-07-17 NOTE — Progress Notes (Signed)
PT Cancellation Note  Patient Details Name: Marcus [name] MRN: [id number] DOB: 05-29-1956   Cancelled Treatment:    Reason Eval/Treat Not Completed: Patient at procedure or test/unavailable.  Gone to procedure and will return as time and pt allow.   Ramond Dial 07/17/2021, 2:15 PM  Mee Hives, PT PhD Acute Rehab Dept. Number: [place]

## 2021-07-17 NOTE — Progress Notes (Signed)
°   07/17/21 1007  TOC ED Mini Assessment  TOC Time spent with patient (minutes): 30  TOC Time saved using PING (minutes): 30  PING Used in TOC Assessment Yes  Admission or Readmission Diverted No  Barriers to Discharge Continued Medical Work up  [employer] [place], [place] @ [place] [phone] or [phone]   Pt active at [facility] Provider: Laurena Bering, [place] SW: Cyndee Brightly   Pager: [phone] Desk phone: [phone]

## 2021-07-17 NOTE — Progress Notes (Signed)
Echocardiogram 2D Echocardiogram has been performed.  Marcus [name] 07/17/2021, 3:12 PM

## 2021-07-17 NOTE — Procedures (Signed)
Interventional Radiology Procedure:   Indications: ESRD  Procedure: Tunneled dialysis catheter placement  Findings: Right jugular Palindrome, tip in right atrium, 23 cm tip to cuff.  Complications: None     EBL: Minimal, less than 10 ml  Plan: Catheter is ready to use.    Tola Meas R. Anselm Pancoast, MD  Pager: [phone]

## 2021-07-17 NOTE — Progress Notes (Signed)
RT NOTE:  Pt does not wish to wear CPAP at this time. He is non compliant at home.

## 2021-07-17 NOTE — Progress Notes (Signed)
PT Cancellation Note  Patient Details Name: Marcus [name] MRN: [id number] DOB: 09/02/1955   Cancelled Treatment:    Reason Eval/Treat Not Completed: Medical issues which prohibited therapy. Pt is fairly hypoglycemic, nursing is managing as last value was 62.  Retry as time and pt allow.   Ramond Dial 07/17/2021, 12:00 PM  Mee Hives, PT PhD Acute Rehab Dept. Number: [place] and [place]

## 2021-07-17 NOTE — Progress Notes (Addendum)
Requested to see pt for out-pt HD needs. Contacted pt via phone and spoke to pt's wife. Pt prefers [facility] and prefers a MWF schedule. Inquired of wife if pt would be agreeable to a TTS til a MWF becomes available due to clinic likely not having a MWF available at this time. Wife states pt really wants a MWF. Inquired if pt would be agreeable to go to a clinic that has a MWF even if clinic is not the closest clinic to pt's home. Wife to discuss this with pt and requests that navigator return call to pt's wife around 4:00. Pt's wife plans to transport pt to HD at d/c. Wife also confirms that [facility]. Will follow and assist.  Melven Sartorius Renal Navigator [phone]   Addendum at 4:12 pm: Spoke to pt via phone. Pt definitely prefers a MWF schedule. Explained to pt that in order to get the MWF schedule, pt will likely have to go to a clinic that is farther away from home than if he was agreeable to TTS. Pt voices understanding and plans to speak to wife tonight about going to a clinic in [place] area in order to get MWF schedule. Navigator will f/u with pt tomorrow am .

## 2021-07-18 ENCOUNTER — Encounter (HOSPITAL_COMMUNITY): Payer: No Typology Code available for payment source

## 2021-07-18 DIAGNOSIS — N179 Acute kidney failure, unspecified: Secondary | ICD-10-CM | POA: Diagnosis not present

## 2021-07-18 LAB — GLUCOSE, CAPILLARY
Glucose-Capillary: 118 mg/dL — ABNORMAL HIGH (ref 70–99)
Glucose-Capillary: 164 mg/dL — ABNORMAL HIGH (ref 70–99)
Glucose-Capillary: 199 mg/dL — ABNORMAL HIGH (ref 70–99)
Glucose-Capillary: 225 mg/dL — ABNORMAL HIGH (ref 70–99)

## 2021-07-18 LAB — BPAM RBC
Blood Product Expiration Date: 202303062359
ISSUE DATE / TIME: 202302080421
Unit Type and Rh: 5100

## 2021-07-18 LAB — CBC
HCT: 26.6 % — ABNORMAL LOW (ref 39.0–52.0)
Hemoglobin: 8.5 g/dL — ABNORMAL LOW (ref 13.0–17.0)
MCH: 30.6 pg (ref 26.0–34.0)
MCHC: 32 g/dL (ref 30.0–36.0)
MCV: 95.7 fL (ref 80.0–100.0)
Platelets: 197 10*3/uL (ref 150–400)
RBC: 2.78 MIL/uL — ABNORMAL LOW (ref 4.22–5.81)
RDW: 17.3 % — ABNORMAL HIGH (ref 11.5–15.5)
WBC: 7.7 10*3/uL (ref 4.0–10.5)
nRBC: 0 % (ref 0.0–0.2)

## 2021-07-18 LAB — MRSA NEXT GEN BY PCR, NASAL: MRSA by PCR Next Gen: NOT DETECTED

## 2021-07-18 LAB — TYPE AND SCREEN
ABO/RH(D): O POS
Antibody Screen: NEGATIVE
Unit division: 0

## 2021-07-18 LAB — RENAL FUNCTION PANEL
Albumin: 2.9 g/dL — ABNORMAL LOW (ref 3.5–5.0)
Anion gap: 15 (ref 5–15)
BUN: 115 mg/dL — ABNORMAL HIGH (ref 8–23)
CO2: 16 mmol/L — ABNORMAL LOW (ref 22–32)
Calcium: 7 mg/dL — ABNORMAL LOW (ref 8.9–10.3)
Chloride: 111 mmol/L (ref 98–111)
Creatinine, Ser: 16.22 mg/dL — ABNORMAL HIGH (ref 0.61–1.24)
GFR, Estimated: 3 mL/min — ABNORMAL LOW (ref >60)
Glucose, Bld: 194 mg/dL — ABNORMAL HIGH (ref 70–99)
Phosphorus: 8.9 mg/dL — ABNORMAL HIGH (ref 2.5–4.6)
Potassium: 5.1 mmol/L (ref 3.5–5.1)
Sodium: 142 mmol/L (ref 135–145)

## 2021-07-18 LAB — HEPATITIS B SURFACE ANTIBODY, QUANTITATIVE: Hep B S AB Quant (Post): <3.1 m[IU]/mL — ABNORMAL LOW (ref >9.9)

## 2021-07-18 MED ORDER — FUROSEMIDE 10 MG/ML IJ SOLN
80.0000 mg | Freq: Every day | INTRAMUSCULAR | Status: DC
  Administered 2021-07-18 – 2021-07-21 (×4): 80 mg via INTRAVENOUS
  Filled 2021-07-18 (×5): qty 8

## 2021-07-18 MED ORDER — LOSARTAN POTASSIUM 50 MG PO TABS
100.0000 mg | ORAL_TABLET | Freq: Every day | ORAL | Status: DC
  Administered 2021-07-18: 100 mg via ORAL
  Filled 2021-07-18: qty 2

## 2021-07-18 MED ORDER — HYDRALAZINE HCL 20 MG/ML IJ SOLN
10.0000 mg | Freq: Four times a day (QID) | INTRAMUSCULAR | Status: DC | PRN
  Administered 2021-07-19 – 2021-07-21 (×6): 10 mg via INTRAVENOUS
  Filled 2021-07-18 (×6): qty 1

## 2021-07-18 MED ORDER — DARBEPOETIN ALFA 100 MCG/0.5ML IJ SOSY: 100.0000 ug | PREFILLED_SYRINGE | INTRAMUSCULAR | Status: DC

## 2021-07-18 MED ORDER — NEPRO/CARBSTEADY PO LIQD
237.0000 mL | ORAL | Status: DC
  Administered 2021-07-18 – 2021-07-21 (×3): 237 mL via ORAL

## 2021-07-18 MED ORDER — INSULIN ASPART 100 UNIT/ML IJ SOLN
0.0000 [IU] | Freq: Every day | INTRAMUSCULAR | Status: DC
  Administered 2021-07-18: 2 [IU] via SUBCUTANEOUS
  Administered 2021-07-19: 3 [IU] via SUBCUTANEOUS
  Administered 2021-07-21: 2 [IU] via SUBCUTANEOUS

## 2021-07-18 MED ORDER — DARBEPOETIN ALFA 100 MCG/0.5ML IJ SOSY
100.0000 ug | PREFILLED_SYRINGE | Freq: Once | INTRAMUSCULAR | Status: AC
  Administered 2021-07-18: 100 ug via SUBCUTANEOUS
  Filled 2021-07-18: qty 0.5

## 2021-07-18 MED ORDER — SEVELAMER CARBONATE 800 MG PO TABS
800.0000 mg | ORAL_TABLET | Freq: Three times a day (TID) | ORAL | Status: DC
  Administered 2021-07-18 – 2021-07-22 (×13): 800 mg via ORAL
  Filled 2021-07-18 (×13): qty 1

## 2021-07-18 MED ORDER — RENA-VITE PO TABS
1.0000 | ORAL_TABLET | Freq: Every day | ORAL | Status: DC
  Administered 2021-07-18 – 2021-07-21 (×4): 1 via ORAL
  Filled 2021-07-18 (×4): qty 1

## 2021-07-18 MED ORDER — INSULIN ASPART 100 UNIT/ML IJ SOLN
0.0000 [IU] | Freq: Three times a day (TID) | INTRAMUSCULAR | Status: DC
  Administered 2021-07-18 – 2021-07-20 (×4): 2 [IU] via SUBCUTANEOUS
  Administered 2021-07-20 – 2021-07-21 (×2): 3 [IU] via SUBCUTANEOUS
  Administered 2021-07-21: 2 [IU] via SUBCUTANEOUS
  Administered 2021-07-21: 1 [IU] via SUBCUTANEOUS
  Administered 2021-07-22 (×2): 2 [IU] via SUBCUTANEOUS

## 2021-07-18 MED ORDER — LABETALOL HCL 5 MG/ML IV SOLN
5.0000 mg | INTRAVENOUS | Status: DC | PRN
  Administered 2021-07-18: 5 mg via INTRAVENOUS
  Filled 2021-07-18: qty 4

## 2021-07-18 MED ORDER — CHLORHEXIDINE GLUCONATE CLOTH 2 % EX PADS: 6.0000 | MEDICATED_PAD | Freq: Every day | CUTANEOUS | Status: DC

## 2021-07-18 NOTE — Progress Notes (Signed)
PT Cancellation Note  Patient Details Name: Neilson Oehlert MRN: [id number] DOB: 31-Dec-1955   Cancelled Treatment:    Reason Eval/Treat Not Completed: Patient at procedure or test/unavailable. Pt at HD treatment. Will plan to follow-up in afternoon as time permits.   Moishe Spice, PT, DPT Acute Rehabilitation Services  Pager: [phone] Office: [place] 07/18/2021, 8:19 AM

## 2021-07-18 NOTE — Progress Notes (Signed)
OT Cancellation Note  Patient Details Name: Marcus [name] MRN: [id number] DOB: 05-Jan-1956   Cancelled Treatment:    Reason Eval/Treat Not Completed: Patient at procedure or test/ unavailable; off floor for HD. OT to check back as time allows.   Gloris Manchester OTR/L Supplemental OT, Department of rehab services [phone]  Halana Deisher R H. 07/18/2021, 8:15 AM

## 2021-07-18 NOTE — Progress Notes (Signed)
Pt refused CPAP. RT will cont to monitor.

## 2021-07-18 NOTE — Progress Notes (Signed)
Initial Nutrition Assessment  DOCUMENTATION CODES:   Not applicable  INTERVENTION:   - RD will follow up for ability to provide renal diet education  - Nepro Shake po daily, each supplement provides 425 kcal and 19 grams protein  - Renal MVI daily  NUTRITION DIAGNOSIS:   Increased nutrient needs related to chronic illness (ESRD) as evidenced by estimated needs.  GOAL:   Patient will meet greater than or equal to 90% of their needs  MONITOR:   PO intake, Supplement acceptance, Labs, Weight trends, I & O's  REASON FOR ASSESSMENT:   Consult Assessment of nutrition requirement/status  ASSESSMENT:   66 year old male who presented to the ED on 2/07 with elevated creatinine and 27 lb weight gain since November 2022. PMH of CKD stage V, depression, T2DM, HTN, HLD. Pt admitted with new ESRD associated with volume overload and uremic symptoms.  02/09 - first iHD  Last HD treatment was this morning with 500 ml net UF. Post HD weight was 132 kg.  Per notes, pt feeling very overwhelmed with new information. RD attempted to speak with pt x 2. On first attempt, pt making phone call and requested RD return later. Upon return, pt still on the phone. Unable to obtain diet and weight history at this time but will re-attempt at follow-up.  Weight history in chart is limited as there is only 1 weight available PTA from 01/10/21. Current weight is up compared to previous weight. Suspect weight gain is related to volume overload.  Pt would likely benefit from renal diet education prior to discharge. Given pt feeling overwhelmed today and unavailable x 2 attempts, RD will plan to provide education and handouts at follow-up.  No PO intakes documented at this time. RD to order renal MVI and Nepro shake daily to aid pt in meeting increased kcal and protein needs.  Admit weight: 131 kg Current weight: 132 kg  Pt with deep pitting edema to BLE.  Medications reviewed and include: aranesp weekly,  IV lasix, SSI, renvela TID  Labs reviewed: BUN 115, creatinine 16.22, ionized calcium 0.98, phosphorus 8.9, magnesium 1.6 on 2/08, hemoglobin 8.5, hemoglobin A1C 5.0 CBG's: 90-188 x 24 hours  I/O's: -620 ml since admit  NUTRITION - FOCUSED PHYSICAL EXAM:  Unable to complete at this time. Will complete at follow-up.  Diet Order:   Diet Order             Diet renal with fluid restriction Fluid restriction: 1200 mL Fluid; Room service appropriate? Yes; Fluid consistency: Thin  Diet effective now                   EDUCATION NEEDS:   Not appropriate for education at this time  Skin:  Skin Assessment: Reviewed RN Assessment  Last BM:  no documented BM  Height:   Ht Readings from Last 1 Encounters:  07/17/21 5\' 6"  (1.676 m)    Weight:   Wt Readings from Last 1 Encounters:  07/18/21 132 kg    BMI:  Body mass index is 46.97 kg/m.  Estimated Nutritional Needs:   Kcal:  2200-2400  Protein:  110-125 grams  Fluid:  1000 ml + UOP    Gustavus Bryant, MS, RD, LDN Inpatient Clinical Dietitian Please see [facility] for contact information.

## 2021-07-18 NOTE — Progress Notes (Signed)
PT Cancellation Note  Patient Details Name: Marcus [name] MRN: [id number] DOB: 12-Nov-1955   Cancelled Treatment:    Reason Eval/Treat Not Completed: Medical issues which prohibited therapy. RN reporting pt is declining all treatment at this time and SBP is in the 230s, with HTN noted earlier when passing by as well this morning. Will plan to follow-up tomorrow as able.   Moishe Spice, PT, DPT Acute Rehabilitation Services  Pager: [phone] Office: [place] 07/18/2021, 12:29 PM

## 2021-07-18 NOTE — Progress Notes (Signed)
[facility] Progress Note  Name: Marcus [name] MRN: [id number] DOB: [birth year]   Subjective:   he had tunn catheter on 2/8 with IR.  HD tx was moved to today due to staffing.  Seen and examined on dialysis.  Blood pressure 181/93 and HR 79.  Tolerating goal.  First tx in process. RIJ tunn catheter in use. We discussed the need for permanent access today and he would like to talk with his wife.   Review of systems:  Denies shortness of breath or chest pain  Denies n/v   Intake/Output Summary (Last 24 hours) at 07/18/2021 0810 Last data filed at 07/17/2021 1514 Gross per 24 hour  Intake 154.13 ml  Output 300 ml  Net -145.87 ml    Vitals:  Vitals:   07/17/21 2215 07/17/21 2227 07/17/21 2244 07/18/21 0400  BP: (!) 179/85  (!) 182/98 (!) 160/89  Pulse: 72  79 71  Resp: (!) 26  19 20   Temp:  98.3 F (36.8 C) 97.9 F (36.6 C) 98 F (36.7 C)  TempSrc:  Oral Oral Oral  SpO2: 96%  96% 100%  Weight:   131 kg   Height:   5\' 6"  (1.676 m)      Physical Exam:  General adult male in bed in no acute distress HEENT normocephalic atraumatic extraocular movements intact sclera anicteric Neck supple trachea midline Lungs clear to auscultation bilaterally normal work of breathing at rest  Heart S1S2 no rub Abdomen soft nontender nondistended Extremities 1-2+ edema lower extremities Psych normal mood and affect Neuro - alert and oriented x 3 provides hx and follows commands  Access: RIJ tunn catheter in place   Medications reviewed   Labs:  BMP Latest Ref Rng & Units 07/17/2021 07/17/2021 07/17/2021  Glucose 70 - 99 mg/dL 98 - 141(H)  BUN 8 - 23 mg/dL 110(H) - 111(H)  Creatinine 0.61 - 1.24 mg/dL 16.12(H) - 16.10(H)  Sodium 135 - 145 mmol/L 142 143 143  Potassium 3.5 - 5.1 mmol/L 4.6 4.7 4.7  Chloride 98 - 111 mmol/L 111 - 110  CO2 22 - 32 mmol/L 17(L) - 16(L)  Calcium 8.9 - 10.3 mg/dL 7.2(L) - 7.2(L)     Assessment/Plan:   # New ESRD associated with  volume overload and uremic symptoms:  - Likely due to hypertension and diabetes.  UA with proteinuria probably due to diabetes without sediment.  Kidney ultrasound with chronic finding with no obstruction.  s/p Tunn catheter on 2/8 with iR.  - HD today  - Plan for next HD on 2/11 for 2nd tx - labs from today are ordered - not yet drawn  - He will need vascular consult for placement of permanent access; vein mapping ordered - I have contacted HD SW to initiate CLIP process for outpatient HD unit   #Anion gap metabolic acidosis due to renal failure: Expect to improve with dialysis.     #Anemia of ESRD: Iron saturation 31% and ferritin 222.  aranesp 100 mcg ordered for HD - not given yesterday - give today   #CKD-MBD: PTH pending.  hyperphosphatemia - for HD.  Start renvela   #Hypertension/volume overload: continue IV lasix - transition to daily for now   Dispo - continue inpatient monitoring    Claudia Desanctis, MD 07/18/2021 8:35 AM

## 2021-07-18 NOTE — Progress Notes (Addendum)
Patient's blood pressure is currently 225/95, tried to give labetalol IV push, patient refused any further medication despite education regarding effects of not treating this high of a blood pressure. I paged Dr.Vann, she is currently talking to patient regarding importance of treating his high blood pressure. Continuing to monitor.

## 2021-07-18 NOTE — Progress Notes (Addendum)
Contacted pt this am via phone. Pt currently receiving HD. Pt reports that he did not have the opportunity to speak to wife regarding how far they would be willing to travel for MWF schedule. Pt requests that navigator stop by later today to discuss things further. Yesterday, pt did state he preferred [facility]. Contacted [facility] admissions this am. Navigator informed that [facility] does not have a MWF. Will proceed with referral this morning to [facility] to locate closest clinic to pt's home that has a MWF available. Message left for Clearnce Sorrel with [facility] regarding pt's HD needs at d/c and pt wanting to use his [employer] benefits. Will follow and assist.   Melven Sartorius Renal Navigator [phone]

## 2021-07-18 NOTE — Progress Notes (Signed)
Progress Note    Marcus [name]  OVF:[id number] DOB: [birth year]  DOA: 07/16/2021 PCP: System, Provider Not In    Brief Narrative:     Medical records reviewed and are as summarized below:  Marcus [name] is a 66 y.o. male with medical history significant of chronic kidney disease stage V, hypertension hyperlipidemia diabetes type 2.  Admitted for  acute on chronic worsening renal function with evidence of fluid overload. First HD session on 2/9.  Patient is feeling overwhelmed with all this information.   Assessment/Plan:   Principal Problem:   AKI (acute kidney injury) ([place]) Active Problems:   Uncontrolled type 2 diabetes mellitus with hypoglycemia, without long-term current use of insulin (HCC)   Essential hypertension   Anemia   Obesity, Class III, BMI 40-49.9 (morbid obesity) ([place])   Cardiomegaly   Hyperkalemia   Uncontrolled type 2 diabetes mellitus with hypoglycemia, without long-term current use of insulin (HCC)  -SSI   New ESRD associated with volume overload and uremic symptoms:  -due to hypertension and diabetes.  UA with proteinuria probably due to diabetes without sediment.  Kidney ultrasound with chronic finding with no obstruction.  Patient received IV diuretics in the ER without much response.   Consult IR to place tunneled HD catheter Per renal: First HD ordered 2/9 and serial HD for 3 days if schedule permits  Essential hypertension -resume home meds and adjust for better control   Anemia S/p 1 unit PRBC per Dr. Roel Cluck   Obesity, Class III, BMI 40-49.9 (morbid obesity) ([place]) Estimated body mass index is 46.97 kg/m as calculated from the following:   Height as of this encounter: 5\' 6"  (1.676 m).   Weight as of this encounter: 132 kg.    Cardiomegaly Patient endorses orthopnea in the setting of fluid overload in the setting of severe chronic kidney disease   Hyperkalemia -lokelma x 1 -resolved with HD     Family  Communication/Anticipated D/C date and plan/Code Status    Code Status: Full Code.  Family Communication: wife at bedside Disposition Plan: Status is: Inpatient Remains inpatient appropriate because: needs HD             Medical Consultants:   renal    Subjective:   Feeling overwhelmed with HD and decisions needing to be made  Objective:    Vitals:   07/18/21 0930 07/18/21 1000 07/18/21 1050 07/18/21 1109  BP: (!) 182/90 (!) 197/94 (!) 198/90 (!) 193/98  Pulse: 75 76    Resp:      Temp:   98.2 F (36.8 C)   TempSrc:   Oral   SpO2:   100%   Weight:   132 kg   Height:        Intake/Output Summary (Last 24 hours) at 07/18/2021 1255 Last data filed at 07/18/2021 1022 Gross per 24 hour  Intake 154.13 ml  Output 800 ml  Net -645.87 ml   Filed Weights   07/17/21 2244 07/18/21 0806 07/18/21 1050  Weight: 131 kg 132.5 kg 132 kg    Exam:   General: Appearance:    Severely obese male in no acute distress     Lungs:     respirations unlabored  Heart:    Normal heart rate. Normal rhythm. No murmurs, rubs, or gallops.    MS:   All extremities are intact.    Neurologic:   Awake, alert, oriented x 3. No apparent focal neurological  defect.        Data Reviewed:   I have personally reviewed following labs and imaging studies:  Labs: Labs show the following:   Basic Metabolic Panel: Recent Labs  Lab 07/16/21 1303 07/16/21 1314 07/16/21 2347 07/16/21 2351 07/17/21 0319 07/17/21 0408 07/17/21 0728 07/18/21 0834  NA 141   < > 142 143 143 143 142 142  K 5.5*   < > 4.9 4.9 4.7 4.7 4.6 5.1  CL 113*   < > 114* 112* 110  --  111 111  CO2 16*  --  16*  --  16*  --  17* 16*  GLUCOSE 70   < > 147* 139* 141*  --  98 194*  BUN 115*   < > 112* >130* 111*  --  110* 115*  CREATININE 16.58*   < > 16.04* 17.30* 16.10*  --  16.12* 16.22*  CALCIUM 7.4*  --  7.1*  --  7.2*  --  7.2* 7.0*  MG  --   --  1.6*  --  1.6*  --   --   --   PHOS  --   --  7.5*   --  7.8*  --  8.2* 8.9*   < > = values in this interval not displayed.   GFR Estimated Creatinine Clearance: 6 mL/min (A) (by C-G formula based on SCr of 16.22 mg/dL (H)). Liver Function Tests: Recent Labs  Lab 07/16/21 1303 07/16/21 2347 07/17/21 0319 07/17/21 0728 07/18/21 0834  AST 14* 10* 12*  --   --   ALT 14 15 15   --   --   ALKPHOS 48 43 43  --   --   BILITOT 0.3 0.3 0.2*  --   --   PROT 6.4* 6.0* 6.1*  --   --   ALBUMIN 3.0* 2.8* 2.8* 2.8* 2.9*   No results for input(s): LIPASE, AMYLASE in the last 168 hours. No results for input(s): AMMONIA in the last 168 hours. Coagulation profile No results for input(s): INR, PROTIME in the last 168 hours.  CBC: Recent Labs  Lab 07/16/21 1303 07/16/21 1314 07/16/21 2347 07/16/21 2351 07/17/21 0319 07/17/21 0408 07/17/21 0728 07/18/21 0834  WBC 6.3  --  6.1  --  5.8  --  6.0 7.7  NEUTROABS 4.2  --  4.3  --  3.9  --   --   --   HGB 7.9*   < > 7.0* 7.5* 7.1* 7.1* 8.1* 8.5*  HCT 24.4*   < > 22.3* 22.0* 22.7* 21.0* 24.6* 26.6*  MCV 97.2  --  98.7  --  100.0  --  93.9 95.7  PLT 188  --  181  --  183  --  190 197   < > = values in this interval not displayed.   Cardiac Enzymes: Recent Labs  Lab 07/16/21 2347  CKTOTAL 669*   BNP (last 3 results) No results for input(s): PROBNP in the last 8760 hours. CBG: Recent Labs  Lab 07/17/21 1547 07/17/21 2107 07/17/21 2342 07/18/21 0632 07/18/21 1212  GLUCAP 90 188* 169* 118* 164*   D-Dimer: No results for input(s): DDIMER in the last 72 hours. Hgb A1c: Recent Labs    07/16/21 2347  HGBA1C 5.0   Lipid Profile: No results for input(s): CHOL, HDL, LDLCALC, TRIG, CHOLHDL, LDLDIRECT in the last 72 hours. Thyroid function studies: Recent Labs    07/17/21 0319  TSH 2.383   Anemia work up: Recent [employer]  07/16/21 2347  VITAMINB12 395  FOLATE 7.6  FERRITIN 222  TIBC 204*  IRON 64  RETICCTPCT 6.9*   Sepsis Labs: Recent Labs  Lab 07/16/21 2347 07/17/21 0319  07/17/21 0728 07/18/21 0834  WBC 6.1 5.8 6.0 7.7    Microbiology Recent Results (from the past 240 hour(s))  Resp Panel by RT-PCR (Flu A&B, Covid) Nasopharyngeal Swab     Status: None   Collection Time: 07/16/21  9:41 PM   Specimen: Nasopharyngeal Swab; Nasopharyngeal(NP) swabs in vial transport medium  Result Value Ref Range Status   SARS Coronavirus 2 by RT PCR NEGATIVE NEGATIVE Final    Comment: (NOTE) SARS-CoV-2 target nucleic acids are NOT DETECTED.  The SARS-CoV-2 RNA is generally detectable in upper respiratory specimens during the acute phase of infection. The lowest concentration of SARS-CoV-2 viral copies this assay can detect is 138 copies/mL. A negative result does not preclude SARS-Cov-2 infection and should not be used as the sole basis for treatment or other patient management decisions. A negative result may occur with  improper specimen collection/handling, submission of specimen other than nasopharyngeal swab, presence of viral mutation(s) within the areas targeted by this assay, and inadequate number of viral copies(<138 copies/mL). A negative result must be combined with clinical observations, patient history, and epidemiological information. The expected result is Negative.  Fact Sheet for Patients:  EntrepreneurPulse.com.au  Fact Sheet for Healthcare Providers:  IncredibleEmployment.be  This test is no t yet approved or cleared by the [place] FDA and  has been authorized for detection and/or diagnosis of SARS-CoV-2 by FDA under an Emergency Use Authorization (EUA). This EUA will remain  in effect (meaning this test can be used) for the duration of the COVID-19 declaration under Section 564(b)(1) of the Act, 21 U.S.C.section 360bbb-3(b)(1), unless the authorization is terminated  or revoked sooner.       Influenza A by PCR NEGATIVE NEGATIVE Final   Influenza B by PCR NEGATIVE NEGATIVE Final    Comment:  (NOTE) The Xpert Xpress SARS-CoV-2/FLU/RSV plus assay is intended as an aid in the diagnosis of influenza from Nasopharyngeal swab specimens and should not be used as a sole basis for treatment. Nasal washings and aspirates are unacceptable for Xpert Xpress SARS-CoV-2/FLU/RSV testing.  Fact Sheet for Patients: EntrepreneurPulse.com.au  Fact Sheet for Healthcare Providers: IncredibleEmployment.be  This test is not yet approved or cleared by the [place] FDA and has been authorized for detection and/or diagnosis of SARS-CoV-2 by FDA under an Emergency Use Authorization (EUA). This EUA will remain in effect (meaning this test can be used) for the duration of the COVID-19 declaration under Section 564(b)(1) of the Act, 21 U.S.C. section 360bbb-3(b)(1), unless the authorization is terminated or revoked.  Performed at [facility], [place] [address], [place], [place] [address]   MRSA Next Gen by PCR, Nasal     Status: None   Collection Time: 07/17/21 10:23 PM   Specimen: Nasal Mucosa; Nasal Swab  Result Value Ref Range Status   MRSA by PCR Next Gen NOT DETECTED NOT DETECTED Final    Comment: (NOTE) The GeneXpert MRSA Assay (FDA approved for NASAL specimens only), is one component of a comprehensive MRSA colonization surveillance program. It is not intended to diagnose MRSA infection nor to guide or monitor treatment for MRSA infections. Test performance is not FDA approved in patients less than 64 years old. Performed at [facility], [place] [address], [place], [place] [address]     Procedures and diagnostic  studies:  DG Chest 2 View  Result Date: 07/16/2021 CLINICAL DATA:  Fluid overload. 27 pound weight gain. Elevated creatinine. EXAM: CHEST - 2 VIEW COMPARISON:  None. FINDINGS: The heart is enlarged. Mild vascular congestion is present. Small effusions are present. Ill-defined scratched at minimal airspace opacity is present at  both lung bases. IMPRESSION: Cardiomegaly with mild vascular congestion and small bilateral pleural effusions compatible with congestive heart failure. Electronically Signed   By: San Morelle M.D.   On: 07/16/2021 13:12   US RENAL  Result Date: 07/16/2021 CLINICAL DATA:  Acute kidney injury EXAM: RENAL / URINARY TRACT ULTRASOUND COMPLETE COMPARISON:  None. FINDINGS: Right Kidney: Renal measurements: 9.3 x 3.7 x 4.4 cm = volume: 78.8 mL. Cortex is slightly echogenic. No mass or hydronephrosis Left Kidney: Renal measurements: 9.3 x 5.5 x 4.7 cm = volume: 126.4 mL. Cortex is slightly echogenic. No mass or hydronephrosis Bladder: Appears normal for degree of bladder distention. Other: None. IMPRESSION: Slightly echogenic cortex consistent with medical renal disease. No hydronephrosis Electronically Signed   By: Donavan Foil M.D.   On: 07/16/2021 23:38   IR Fluoro Guide CV Line Right  Result Date: 07/17/2021 INDICATION: 66 year old with end-stage renal disease. Tunneled catheter needed for hemodialysis. EXAM: FLUOROSCOPIC AND ULTRASOUND GUIDED PLACEMENT OF A TUNNELED DIALYSIS CATHETER Physician: Stephan Minister. Anselm Pancoast, MD MEDICATIONS: Ancef 2 g; The antibiotic was administered within an appropriate time interval prior to skin puncture. ANESTHESIA/SEDATION: Moderate (conscious) sedation was employed during this procedure. A total of Versed 1.5mg  and fentanyl 25 mcg was administered intravenously at the order of the provider performing the procedure. Total intra-service moderate sedation time: 26 minutes. Patient's level of consciousness and vital signs were monitored continuously by radiology nurse throughout the procedure under the supervision of the provider performing the procedure. FLUOROSCOPY TIME:  Radiation Exposure Index (as provided by the fluoroscopic device): 86.5 mGy Kerma COMPLICATIONS: None immediate. PROCEDURE: The procedure was explained to the patient. The risks and benefits of the procedure were  discussed and the patient's questions were addressed. Informed consent was obtained from the patient. The patient was placed supine on the interventional table. Ultrasound confirmed a patent right internal jugular vein. Ultrasound image obtained for documentation. The right neck and chest was prepped and draped in a sterile fashion. Maximal barrier sterile technique was utilized including caps, mask, sterile gowns, sterile gloves, sterile drape, hand hygiene and skin antiseptic. The right neck was anesthetized with 1% lidocaine. A small incision was made with #11 blade scalpel. A 21 gauge needle directed into the right internal jugular vein with ultrasound guidance. A micropuncture dilator set was placed. A 23 cm tip to cuff Palindrome catheter was selected. The skin below the right clavicle was anesthetized and a small incision was made with an #11 blade scalpel. A subcutaneous tunnel was formed to the vein dermatotomy site. The catheter was brought through the tunnel. The vein dermatotomy site was dilated to accommodate a peel-away sheath. The catheter was placed through the peel-away sheath and directed into the central venous structures. The tip of the catheter was placed in the upper right atrium with fluoroscopy. Fluoroscopic images were obtained for documentation. Both lumens were found to aspirate and flush well. The proper amount of heparin was flushed in both lumens. The vein dermatotomy site was closed using a single layer of absorbable suture and Dermabond. Gel-Foam was placed in subcutaneous tract. The catheter was secured to the skin using Prolene suture. IMPRESSION: Successful placement of a right jugular tunneled dialysis  catheter using ultrasound and fluoroscopic guidance. Electronically Signed   By: Markus Daft M.D.   On: 07/17/2021 15:23   IR US Guide Vasc Access Right  Result Date: 07/17/2021 INDICATION: 66 year old with end-stage renal disease. Tunneled catheter needed for hemodialysis. EXAM:  FLUOROSCOPIC AND ULTRASOUND GUIDED PLACEMENT OF A TUNNELED DIALYSIS CATHETER Physician: Stephan Minister. Anselm Pancoast, MD MEDICATIONS: Ancef 2 g; The antibiotic was administered within an appropriate time interval prior to skin puncture. ANESTHESIA/SEDATION: Moderate (conscious) sedation was employed during this procedure. A total of Versed 1.5mg  and fentanyl 25 mcg was administered intravenously at the order of the provider performing the procedure. Total intra-service moderate sedation time: 26 minutes. Patient's level of consciousness and vital signs were monitored continuously by radiology nurse throughout the procedure under the supervision of the provider performing the procedure. FLUOROSCOPY TIME:  Radiation Exposure Index (as provided by the fluoroscopic device): 74.1 mGy Kerma COMPLICATIONS: None immediate. PROCEDURE: The procedure was explained to the patient. The risks and benefits of the procedure were discussed and the patient's questions were addressed. Informed consent was obtained from the patient. The patient was placed supine on the interventional table. Ultrasound confirmed a patent right internal jugular vein. Ultrasound image obtained for documentation. The right neck and chest was prepped and draped in a sterile fashion. Maximal barrier sterile technique was utilized including caps, mask, sterile gowns, sterile gloves, sterile drape, hand hygiene and skin antiseptic. The right neck was anesthetized with 1% lidocaine. A small incision was made with #11 blade scalpel. A 21 gauge needle directed into the right internal jugular vein with ultrasound guidance. A micropuncture dilator set was placed. A 23 cm tip to cuff Palindrome catheter was selected. The skin below the right clavicle was anesthetized and a small incision was made with an #11 blade scalpel. A subcutaneous tunnel was formed to the vein dermatotomy site. The catheter was brought through the tunnel. The vein dermatotomy site was dilated to accommodate  a peel-away sheath. The catheter was placed through the peel-away sheath and directed into the central venous structures. The tip of the catheter was placed in the upper right atrium with fluoroscopy. Fluoroscopic images were obtained for documentation. Both lumens were found to aspirate and flush well. The proper amount of heparin was flushed in both lumens. The vein dermatotomy site was closed using a single layer of absorbable suture and Dermabond. Gel-Foam was placed in subcutaneous tract. The catheter was secured to the skin using Prolene suture. IMPRESSION: Successful placement of a right jugular tunneled dialysis catheter using ultrasound and fluoroscopic guidance. Electronically Signed   By: Markus Daft M.D.   On: 07/17/2021 15:23   ECHOCARDIOGRAM COMPLETE  Result Date: 07/17/2021    ECHOCARDIOGRAM REPORT   Patient Name:   Marcus [name] Date of Exam: 07/17/2021 Medical Rec #:  [id number]                    Height:       67.0 in Accession #:    [id number]                   Weight:       274.0 lb Date of Birth:  08-21-1955                    BSA:          2.312 m Patient Age:    42 years  BP:           195/91 mmHg Patient Gender: M                            HR:           76 bpm. Exam Location:  Inpatient Procedure: 2D Echo Indications:    Cardiomegaly  History:        Patient has no prior history of Echocardiogram examinations.                 Risk Factors:Diabetes and Hypertension.  Sonographer:    Jefferey Pica Referring Phys: [place]  Sonographer Comments: Study is incomplete. Patient refused to proceed with rest of test due to lack of food and number of recent procedures performed. IMPRESSIONS  1. Left ventricular ejection fraction, by estimation, is 60 to 65%. The left ventricle has normal function. There is moderate concentric left ventricular hypertrophy.  2. The mitral valve is grossly normal. No evidence of mitral valve regurgitation.  3. The aortic  valve is grossly normal. Aortic valve regurgitation is not visualized. Comparison(s): No prior Echocardiogram. Conclusion(s)/Recommendation(s): Limited echo. FINDINGS  Left Ventricle: Left ventricular ejection fraction, by estimation, is 60 to 65%. The left ventricle has normal function. The left ventricular internal cavity size was normal in size. There is moderate concentric left ventricular hypertrophy. Mitral Valve: The mitral valve is grossly normal. No evidence of mitral valve regurgitation. Aortic Valve: The aortic valve is grossly normal. Aortic valve regurgitation is not visualized. Pulmonic Valve: Pulmonic valve regurgitation is not visualized.  LEFT VENTRICLE PLAX 2D LVIDd:         5.00 cm LVIDs:         3.40 cm LV PW:         1.50 cm LV IVS:        1.50 cm LVOT diam:     2.00 cm LVOT Area:     3.14 cm  LEFT ATRIUM         Index LA diam:    4.10 cm 1.77 cm/m                        PULMONIC VALVE AORTA                 PV Vmax:       0.90 m/s Ao Root diam: 3.60 cm PV Peak grad:  3.2 mmHg Ao Asc diam:  3.40 cm   SHUNTS Systemic Diam: 2.00 cm Phineas Inches Electronically signed by Phineas Inches Signature Date/Time: 07/17/2021/5:54:43 PM    Final     Medications:    atorvastatin  10 mg Oral QHS   carvedilol  25 mg Oral BID WC   Chlorhexidine Gluconate Cloth  6 each Topical Q0600   [START ON 07/25/2021] darbepoetin (ARANESP) injection - DIALYSIS  100 mcg Intravenous Q Thu-HD   darbepoetin (ARANESP) injection - NON-DIALYSIS  100 mcg Subcutaneous Once   [START ON 07/19/2021] furosemide  80 mg Intravenous Daily   insulin aspart  0-5 Units Subcutaneous QHS   insulin aspart  0-9 Units Subcutaneous TID WC   losartan  100 mg Oral QHS   mometasone-formoterol  2 puff Inhalation BID   sevelamer carbonate  800 mg Oral TID WC   sodium chloride flush  3 mL Intravenous Q12H   Continuous Infusions:  sodium chloride       LOS: 2 days  Geradine Girt  [facility]   How to contact the [facility] Attending  or Consulting provider [place] or covering provider during after hours [place], for this patient?  Check the care team in [facility] and look for a) attending/consulting [facility] provider listed and b) the [facility] team listed Log into [email or web address] and use [place]'s universal password to access. If you do not have the password, please contact the hospital operator. Locate the [facility] provider you are looking for under [facility] and page to a number that you can be directly reached. If you still have difficulty reaching the provider, please page the [facility] (Director on Call) for the Hospitalists listed on amion for assistance.  07/18/2021, 12:55 PM

## 2021-07-18 NOTE — Progress Notes (Signed)
Placed patient on CPAP for the night via auto-mode.  

## 2021-07-19 ENCOUNTER — Inpatient Hospital Stay (HOSPITAL_COMMUNITY): Payer: No Typology Code available for payment source

## 2021-07-19 DIAGNOSIS — Z992 Dependence on renal dialysis: Secondary | ICD-10-CM

## 2021-07-19 DIAGNOSIS — Z8249 Family history of ischemic heart disease and other diseases of the circulatory system: Secondary | ICD-10-CM

## 2021-07-19 DIAGNOSIS — N179 Acute kidney failure, unspecified: Secondary | ICD-10-CM | POA: Diagnosis not present

## 2021-07-19 DIAGNOSIS — N186 End stage renal disease: Secondary | ICD-10-CM

## 2021-07-19 DIAGNOSIS — Z841 Family history of disorders of kidney and ureter: Secondary | ICD-10-CM

## 2021-07-19 DIAGNOSIS — Z4901 Encounter for fitting and adjustment of extracorporeal dialysis catheter: Secondary | ICD-10-CM

## 2021-07-19 LAB — RENAL FUNCTION PANEL
Albumin: 2.7 g/dL — ABNORMAL LOW (ref 3.5–5.0)
Anion gap: 12 (ref 5–15)
BUN: 92 mg/dL — ABNORMAL HIGH (ref 8–23)
CO2: 21 mmol/L — ABNORMAL LOW (ref 22–32)
Calcium: 7.2 mg/dL — ABNORMAL LOW (ref 8.9–10.3)
Chloride: 108 mmol/L (ref 98–111)
Creatinine, Ser: 13.44 mg/dL — ABNORMAL HIGH (ref 0.61–1.24)
GFR, Estimated: 4 mL/min — ABNORMAL LOW (ref >60)
Glucose, Bld: 154 mg/dL — ABNORMAL HIGH (ref 70–99)
Phosphorus: 6.3 mg/dL — ABNORMAL HIGH (ref 2.5–4.6)
Potassium: 4.3 mmol/L (ref 3.5–5.1)
Sodium: 141 mmol/L (ref 135–145)

## 2021-07-19 LAB — CBC
HCT: 24.6 % — ABNORMAL LOW (ref 39.0–52.0)
Hemoglobin: 8.1 g/dL — ABNORMAL LOW (ref 13.0–17.0)
MCH: 30.7 pg (ref 26.0–34.0)
MCHC: 32.9 g/dL (ref 30.0–36.0)
MCV: 93.2 fL (ref 80.0–100.0)
Platelets: 179 10*3/uL (ref 150–400)
RBC: 2.64 MIL/uL — ABNORMAL LOW (ref 4.22–5.81)
RDW: 17.2 % — ABNORMAL HIGH (ref 11.5–15.5)
WBC: 6.4 10*3/uL (ref 4.0–10.5)
nRBC: 0 % (ref 0.0–0.2)

## 2021-07-19 LAB — PARATHYROID HORMONE, INTACT (NO CA): PTH: 405 pg/mL — ABNORMAL HIGH (ref 15–65)

## 2021-07-19 LAB — GLUCOSE, CAPILLARY
Glucose-Capillary: 110 mg/dL — ABNORMAL HIGH (ref 70–99)
Glucose-Capillary: 186 mg/dL — ABNORMAL HIGH (ref 70–99)
Glucose-Capillary: 291 mg/dL — ABNORMAL HIGH (ref 70–99)

## 2021-07-19 MED ORDER — CHLORHEXIDINE GLUCONATE CLOTH 2 % EX PADS
6.0000 | MEDICATED_PAD | Freq: Every day | CUTANEOUS | Status: DC
  Administered 2021-07-20 – 2021-07-22 (×3): 6 via TOPICAL

## 2021-07-19 MED ORDER — LOSARTAN POTASSIUM 50 MG PO TABS
50.0000 mg | ORAL_TABLET | Freq: Every day | ORAL | Status: DC
  Administered 2021-07-19 – 2021-07-20 (×2): 50 mg via ORAL
  Filled 2021-07-19 (×2): qty 1

## 2021-07-19 MED ORDER — KIDNEY FAILURE BOOK: Freq: Once | Status: AC

## 2021-07-19 NOTE — Procedures (Signed)
Seen and examined on dialysis.  Blood pressure 206/97. RIJ tunn catheter in use.  Tolerating goal.  Increased UF goal.  Spoke with RN.   Claudia Desanctis, MD 07/19/2021  9:45 AM

## 2021-07-19 NOTE — Progress Notes (Signed)
Progress Note    Marcus [name]  EHM:[id number] DOB: [birth year]  DOA: 07/16/2021 PCP: System, Provider Not In    Brief Narrative:     Medical records reviewed and are as summarized below:  Ronan is a 66 y.o. male with medical history significant of chronic kidney disease stage V, hypertension hyperlipidemia diabetes type 2.  Admitted for  acute on chronic worsening renal function with evidence of fluid overload. First HD session on 2/9, 2nd session on 2/10.  Getting vein mapping today and needs to be  CLIPPED prior to d/c   Assessment/Plan:   Principal Problem:   AKI (acute kidney injury) ([place]) Active Problems:   Uncontrolled type 2 diabetes mellitus with hypoglycemia, without long-term current use of insulin (HCC)   Essential hypertension   Anemia   Obesity, Class III, BMI 40-49.9 (morbid obesity) ([place])   Cardiomegaly   Hyperkalemia   Uncontrolled type 2 diabetes mellitus with hypoglycemia, without long-term current use of insulin (HCC)  -SSI -? Resume glipize at a lower dose at home   New ESRD associated with volume overload and uremic symptoms:  -due to hypertension and diabetes.  UA with proteinuria probably due to diabetes without sediment.  Kidney ultrasound with chronic finding with no obstruction.  Patient received IV diuretics in the ER without much response.   Consult IR to place tunneled HD catheter Per renal: First HD ordered 2/9 and serial HD for 3 days if schedule permits  Essential hypertension -resume home meds and adjust for better control   Anemia S/p 1 unit PRBC per Dr. Roel Cluck   Obesity, Class III, BMI 40-49.9 (morbid obesity) ([place]) Estimated body mass index is 45.83 kg/m as calculated from the following:   Height as of this encounter: 5\' 6"  (1.676 m).   Weight as of this encounter: 128.8 kg.    Cardiomegaly Patient endorses orthopnea in the setting of fluid overload in the setting of severe chronic kidney  disease   Hyperkalemia -lokelma x 1 -resolved with HD     Family Communication/Anticipated D/C date and plan/Code Status    Code Status: Full Code.  Family Communication: wife at bedside Disposition Plan: Status is: Inpatient Remains inpatient appropriate because: needs HD             Medical Consultants:   Renal vascular    Subjective:   Ok to get vein mapping  Objective:    Vitals:   07/19/21 1000 07/19/21 1030 07/19/21 1100 07/19/21 1130  BP: (!) 204/94 (!) 199/94 (!) 217/95 (!) 110/93  Pulse: 75 74 80 77  Resp:   18 19  Temp:      TempSrc:      SpO2:      Weight:      Height:        Intake/Output Summary (Last 24 hours) at 07/19/2021 1338 Last data filed at 07/19/2021 3662 Gross per 24 hour  Intake --  Output 1525 ml  Net -1525 ml   Filed Weights   07/18/21 1050 07/19/21 0624 07/19/21 0836  Weight: 132 kg 128 kg 128.8 kg    Exam:    General: Appearance:    Severely obese male in no acute distress- sitting in chair     Lungs:     respirations unlabored  Heart:    Normal heart rate.    MS:   All extremities are intact.    Neurologic:   Awake, alert, oriented x 3  Data Reviewed:   I have personally reviewed following labs and imaging studies:  Labs: Labs show the following:   Basic Metabolic Panel: Recent Labs  Lab 07/16/21 2347 07/16/21 2351 07/17/21 0319 07/17/21 0408 07/17/21 0728 07/18/21 0834 07/19/21 0118  NA 142 143 143 143 142 142 141  K 4.9 4.9 4.7 4.7 4.6 5.1 4.3  CL 114* 112* 110  --  111 111 108  CO2 16*  --  16*  --  17* 16* 21*  GLUCOSE 147* 139* 141*  --  98 194* 154*  BUN 112* >130* 111*  --  110* 115* 92*  CREATININE 16.04* 17.30* 16.10*  --  16.12* 16.22* 13.44*  CALCIUM 7.1*  --  7.2*  --  7.2* 7.0* 7.2*  MG 1.6*  --  1.6*  --   --   --   --   PHOS 7.5*  --  7.8*  --  8.2* 8.9* 6.3*   GFR Estimated Creatinine Clearance: 7.1 mL/min (A) (by C-G formula based on SCr of 13.44 mg/dL  (H)). Liver Function Tests: Recent Labs  Lab 07/16/21 1303 07/16/21 2347 07/17/21 0319 07/17/21 0728 07/18/21 0834 07/19/21 0118  AST 14* 10* 12*  --   --   --   ALT 14 15 15   --   --   --   ALKPHOS 48 43 43  --   --   --   BILITOT 0.3 0.3 0.2*  --   --   --   PROT 6.4* 6.0* 6.1*  --   --   --   ALBUMIN 3.0* 2.8* 2.8* 2.8* 2.9* 2.7*   No results for input(s): LIPASE, AMYLASE in the last 168 hours. No results for input(s): AMMONIA in the last 168 hours. Coagulation profile No results for input(s): INR, PROTIME in the last 168 hours.  CBC: Recent Labs  Lab 07/16/21 1303 07/16/21 1314 07/16/21 2347 07/16/21 2351 07/17/21 0319 07/17/21 0408 07/17/21 0728 07/18/21 0834 07/19/21 0118  WBC 6.3  --  6.1  --  5.8  --  6.0 7.7 6.4  NEUTROABS 4.2  --  4.3  --  3.9  --   --   --   --   HGB 7.9*   < > 7.0*   < > 7.1* 7.1* 8.1* 8.5* 8.1*  HCT 24.4*   < > 22.3*   < > 22.7* 21.0* 24.6* 26.6* 24.6*  MCV 97.2  --  98.7  --  100.0  --  93.9 95.7 93.2  PLT 188  --  181  --  183  --  190 197 179   < > = values in this interval not displayed.   Cardiac Enzymes: Recent Labs  Lab 07/16/21 2347  CKTOTAL 669*   BNP (last 3 results) No results for input(s): PROBNP in the last 8760 hours. CBG: Recent Labs  Lab 07/18/21 0632 07/18/21 1212 07/18/21 1612 07/18/21 2132 07/19/21 0621  GLUCAP 118* 164* 199* 225* 110*   D-Dimer: No results for input(s): DDIMER in the last 72 hours. Hgb A1c: Recent Labs    07/16/21 2347  HGBA1C 5.0   Lipid Profile: No results for input(s): CHOL, HDL, LDLCALC, TRIG, CHOLHDL, LDLDIRECT in the last 72 hours. Thyroid function studies: Recent Labs    07/17/21 0319  TSH 2.383   Anemia work up: Recent Labs    07/16/21 2347  VITAMINB12 395  FOLATE 7.6  FERRITIN 222  TIBC 204*  IRON 64  RETICCTPCT 6.9*   Sepsis Labs:  Recent Labs  Lab 07/17/21 0319 07/17/21 0728 07/18/21 0834 07/19/21 0118  WBC 5.8 6.0 7.7 6.4     Microbiology Recent Results (from the past 240 hour(s))  Resp Panel by RT-PCR (Flu A&B, Covid) Nasopharyngeal Swab     Status: None   Collection Time: 07/16/21  9:41 PM   Specimen: Nasopharyngeal Swab; Nasopharyngeal(NP) swabs in vial transport medium  Result Value Ref Range Status   SARS Coronavirus 2 by RT PCR NEGATIVE NEGATIVE Final    Comment: (NOTE) SARS-CoV-2 target nucleic acids are NOT DETECTED.  The SARS-CoV-2 RNA is generally detectable in upper respiratory specimens during the acute phase of infection. The lowest concentration of SARS-CoV-2 viral copies this assay can detect is 138 copies/mL. A negative result does not preclude SARS-Cov-2 infection and should not be used as the sole basis for treatment or other patient management decisions. A negative result may occur with  improper specimen collection/handling, submission of specimen other than nasopharyngeal swab, presence of viral mutation(s) within the areas targeted by this assay, and inadequate number of viral copies(<138 copies/mL). A negative result must be combined with clinical observations, patient history, and epidemiological information. The expected result is Negative.  Fact Sheet for Patients:  EntrepreneurPulse.com.au  Fact Sheet for Healthcare Providers:  IncredibleEmployment.be  This test is no t yet approved or cleared by the [place] FDA and  has been authorized for detection and/or diagnosis of SARS-CoV-2 by FDA under an Emergency Use Authorization (EUA). This EUA will remain  in effect (meaning this test can be used) for the duration of the COVID-19 declaration under Section 564(b)(1) of the Act, 21 U.S.C.section 360bbb-3(b)(1), unless the authorization is terminated  or revoked sooner.       Influenza A by PCR NEGATIVE NEGATIVE Final   Influenza B by PCR NEGATIVE NEGATIVE Final    Comment: (NOTE) The Xpert Xpress SARS-CoV-2/FLU/RSV plus assay is  intended as an aid in the diagnosis of influenza from Nasopharyngeal swab specimens and should not be used as a sole basis for treatment. Nasal washings and aspirates are unacceptable for Xpert Xpress SARS-CoV-2/FLU/RSV testing.  Fact Sheet for Patients: EntrepreneurPulse.com.au  Fact Sheet for Healthcare Providers: IncredibleEmployment.be  This test is not yet approved or cleared by the [place] FDA and has been authorized for detection and/or diagnosis of SARS-CoV-2 by FDA under an Emergency Use Authorization (EUA). This EUA will remain in effect (meaning this test can be used) for the duration of the COVID-19 declaration under Section 564(b)(1) of the Act, 21 U.S.C. section 360bbb-3(b)(1), unless the authorization is terminated or revoked.  Performed at [facility], [place] [address] [place], [place] [address]   MRSA Next Gen by PCR, Nasal     Status: None   Collection Time: 07/17/21 10:23 PM   Specimen: Nasal Mucosa; Nasal Swab  Result Value Ref Range Status   MRSA by PCR Next Gen NOT DETECTED NOT DETECTED Final    Comment: (NOTE) The GeneXpert MRSA Assay (FDA approved for NASAL specimens only), is one component of a comprehensive MRSA colonization surveillance program. It is not intended to diagnose MRSA infection nor to guide or monitor treatment for MRSA infections. Test performance is not FDA approved in patients less than 41 years old. Performed at [facility], [place] [address] [place], [place] [address]     Procedures and diagnostic studies:  IR Fluoro Guide CV Line Right  Result Date: 07/17/2021 INDICATION: 66 year old with end-stage renal disease. Tunneled catheter needed for hemodialysis. EXAM:  FLUOROSCOPIC AND ULTRASOUND GUIDED PLACEMENT OF A TUNNELED DIALYSIS CATHETER Physician: Stephan Minister. Anselm Pancoast, MD MEDICATIONS: Ancef 2 g; The antibiotic was administered within an appropriate time interval prior to skin puncture.  ANESTHESIA/SEDATION: Moderate (conscious) sedation was employed during this procedure. A total of Versed 1.5mg  and fentanyl 25 mcg was administered intravenously at the order of the provider performing the procedure. Total intra-service moderate sedation time: 26 minutes. Patient's level of consciousness and vital signs were monitored continuously by radiology nurse throughout the procedure under the supervision of the provider performing the procedure. FLUOROSCOPY TIME:  Radiation Exposure Index (as provided by the fluoroscopic device): 14.4 mGy Kerma COMPLICATIONS: None immediate. PROCEDURE: The procedure was explained to the patient. The risks and benefits of the procedure were discussed and the patient's questions were addressed. Informed consent was obtained from the patient. The patient was placed supine on the interventional table. Ultrasound confirmed a patent right internal jugular vein. Ultrasound image obtained for documentation. The right neck and chest was prepped and draped in a sterile fashion. Maximal barrier sterile technique was utilized including caps, mask, sterile gowns, sterile gloves, sterile drape, hand hygiene and skin antiseptic. The right neck was anesthetized with 1% lidocaine. A small incision was made with #11 blade scalpel. A 21 gauge needle directed into the right internal jugular vein with ultrasound guidance. A micropuncture dilator set was placed. A 23 cm tip to cuff Palindrome catheter was selected. The skin below the right clavicle was anesthetized and a small incision was made with an #11 blade scalpel. A subcutaneous tunnel was formed to the vein dermatotomy site. The catheter was brought through the tunnel. The vein dermatotomy site was dilated to accommodate a peel-away sheath. The catheter was placed through the peel-away sheath and directed into the central venous structures. The tip of the catheter was placed in the upper right atrium with fluoroscopy. Fluoroscopic images  were obtained for documentation. Both lumens were found to aspirate and flush well. The proper amount of heparin was flushed in both lumens. The vein dermatotomy site was closed using a single layer of absorbable suture and Dermabond. Gel-Foam was placed in subcutaneous tract. The catheter was secured to the skin using Prolene suture. IMPRESSION: Successful placement of a right jugular tunneled dialysis catheter using ultrasound and fluoroscopic guidance. Electronically Signed   By: Markus Daft M.D.   On: 07/17/2021 15:23   IR US Guide Vasc Access Right  Result Date: 07/17/2021 INDICATION: 66 year old with end-stage renal disease. Tunneled catheter needed for hemodialysis. EXAM: FLUOROSCOPIC AND ULTRASOUND GUIDED PLACEMENT OF A TUNNELED DIALYSIS CATHETER Physician: Stephan Minister. Anselm Pancoast, MD MEDICATIONS: Ancef 2 g; The antibiotic was administered within an appropriate time interval prior to skin puncture. ANESTHESIA/SEDATION: Moderate (conscious) sedation was employed during this procedure. A total of Versed 1.5mg  and fentanyl 25 mcg was administered intravenously at the order of the provider performing the procedure. Total intra-service moderate sedation time: 26 minutes. Patient's level of consciousness and vital signs were monitored continuously by radiology nurse throughout the procedure under the supervision of the provider performing the procedure. FLUOROSCOPY TIME:  Radiation Exposure Index (as provided by the fluoroscopic device): 31.5 mGy Kerma COMPLICATIONS: None immediate. PROCEDURE: The procedure was explained to the patient. The risks and benefits of the procedure were discussed and the patient's questions were addressed. Informed consent was obtained from the patient. The patient was placed supine on the interventional table. Ultrasound confirmed a patent right internal jugular vein. Ultrasound image obtained for documentation. The right neck and chest  was prepped and draped in a sterile fashion. Maximal  barrier sterile technique was utilized including caps, mask, sterile gowns, sterile gloves, sterile drape, hand hygiene and skin antiseptic. The right neck was anesthetized with 1% lidocaine. A small incision was made with #11 blade scalpel. A 21 gauge needle directed into the right internal jugular vein with ultrasound guidance. A micropuncture dilator set was placed. A 23 cm tip to cuff Palindrome catheter was selected. The skin below the right clavicle was anesthetized and a small incision was made with an #11 blade scalpel. A subcutaneous tunnel was formed to the vein dermatotomy site. The catheter was brought through the tunnel. The vein dermatotomy site was dilated to accommodate a peel-away sheath. The catheter was placed through the peel-away sheath and directed into the central venous structures. The tip of the catheter was placed in the upper right atrium with fluoroscopy. Fluoroscopic images were obtained for documentation. Both lumens were found to aspirate and flush well. The proper amount of heparin was flushed in both lumens. The vein dermatotomy site was closed using a single layer of absorbable suture and Dermabond. Gel-Foam was placed in subcutaneous tract. The catheter was secured to the skin using Prolene suture. IMPRESSION: Successful placement of a right jugular tunneled dialysis catheter using ultrasound and fluoroscopic guidance. Electronically Signed   By: Markus Daft M.D.   On: 07/17/2021 15:23   ECHOCARDIOGRAM COMPLETE  Result Date: 07/17/2021    ECHOCARDIOGRAM REPORT   Patient Name:   Marcus ARCHIE MOORE Sperl Date of Exam: 07/17/2021 Medical Rec #:  [id number]                    Height:       67.0 in Accession #:    [id number]                   Weight:       274.0 lb Date of Birth:  04-08-1956                    BSA:          2.312 m Patient Age:    64 years                     BP:           195/91 mmHg Patient Gender: M                            HR:           76 bpm. Exam Location:   Inpatient Procedure: 2D Echo Indications:    Cardiomegaly  History:        Patient has no prior history of Echocardiogram examinations.                 Risk Factors:Diabetes and Hypertension.  Sonographer:    Jefferey Pica Referring Phys: [place]  Sonographer Comments: Study is incomplete. Patient refused to proceed with rest of test due to lack of food and number of recent procedures performed. IMPRESSIONS  1. Left ventricular ejection fraction, by estimation, is 60 to 65%. The left ventricle has normal function. There is moderate concentric left ventricular hypertrophy.  2. The mitral valve is grossly normal. No evidence of mitral valve regurgitation.  3. The aortic valve is grossly normal. Aortic valve regurgitation is not visualized. Comparison(s): No prior Echocardiogram. Conclusion(s)/Recommendation(s): Limited echo. FINDINGS  Left  Ventricle: Left ventricular ejection fraction, by estimation, is 60 to 65%. The left ventricle has normal function. The left ventricular internal cavity size was normal in size. There is moderate concentric left ventricular hypertrophy. Mitral Valve: The mitral valve is grossly normal. No evidence of mitral valve regurgitation. Aortic Valve: The aortic valve is grossly normal. Aortic valve regurgitation is not visualized. Pulmonic Valve: Pulmonic valve regurgitation is not visualized.  LEFT VENTRICLE PLAX 2D LVIDd:         5.00 cm LVIDs:         3.40 cm LV PW:         1.50 cm LV IVS:        1.50 cm LVOT diam:     2.00 cm LVOT Area:     3.14 cm  LEFT ATRIUM         Index LA diam:    4.10 cm 1.77 cm/m                        PULMONIC VALVE AORTA                 PV Vmax:       0.90 m/s Ao Root diam: 3.60 cm PV Peak grad:  3.2 mmHg Ao Asc diam:  3.40 cm   SHUNTS Systemic Diam: 2.00 cm Phineas Inches Electronically signed by Phineas Inches Signature Date/Time: 07/17/2021/5:54:43 PM    Final     Medications:    atorvastatin  10 mg Oral QHS   carvedilol  25 mg Oral BID WC    Chlorhexidine Gluconate Cloth  6 each Topical Q0600   [START ON 07/25/2021] darbepoetin (ARANESP) injection - DIALYSIS  100 mcg Intravenous Q Thu-HD   feeding supplement (NEPRO CARB STEADY)  237 mL Oral Q24H   furosemide  80 mg Intravenous Daily   insulin aspart  0-5 Units Subcutaneous QHS   insulin aspart  0-9 Units Subcutaneous TID WC   losartan  50 mg Oral QHS   mometasone-formoterol  2 puff Inhalation BID   multivitamin  1 tablet Oral QHS   sevelamer carbonate  800 mg Oral TID WC   sodium chloride flush  3 mL Intravenous Q12H   Continuous Infusions:  sodium chloride       LOS: 3 days   Geradine Girt  [facility]   How to contact the [facility] Attending or Consulting provider [place] or covering provider during after hours [place], for this patient?  Check the care team in [facility] and look for a) attending/consulting [facility] provider listed and b) the [facility] team listed Log into [email or web address] and use [place]'s universal password to access. If you do not have the password, please contact the hospital operator. Locate the [facility] provider you are looking for under [facility] and page to a number that you can be directly reached. If you still have difficulty reaching the provider, please page the [facility] (Director on Call) for the Hospitalists listed on amion for assistance.  07/19/2021, 1:38 PM

## 2021-07-19 NOTE — Progress Notes (Signed)
Patient refused the use of CPAP for the evening.  

## 2021-07-19 NOTE — Progress Notes (Signed)
OT Cancellation Note  Patient Details Name: Marcus [name] MRN: [id number] DOB: [birth year]   Cancelled Treatment:    Reason Eval/Treat Not Completed: Patient at procedure or test/ unavailable (HD)  Jeri Modena 07/19/2021, 10:09 AM  Fleeta Emmer, OTR/L  Acute Rehabilitation Services Pager: [phone] Office: [phone] .

## 2021-07-19 NOTE — Progress Notes (Signed)
Mobility Specialist Progress Note    07/19/21 1629  Mobility  Activity Contraindicated/medical hold   Pt BP 198/92 and c/o dizziness. RN notified. Will f/u as schedule permits.  [facility] Mobility Specialist  M.S. 5N: [phone]

## 2021-07-19 NOTE — Consult Note (Signed)
[facility] OF   ASSESSMENT / PLAN: 66 y.o. male with new diagnosis of ESRD. He is hesitant about pursuing an arteriovenous fistula. He has a tunneled dialysis catheter in and is tolerating dialysis well. Vein mapping is pending. Per his wishes, we will create dialysis access for him in an elective manner in the next 1-2 weeks. He is safe for discharge today after vein mapping from my standpoint.  CHIEF COMPLAINT: new diagnosis renal failure  HISTORY OF PRESENT ILLNESS: Marcus [name] is a 66 y.o. male with new diagnosis of end-stage renal disease.  The patient has had a tunneled dialysis catheter placed.  He has undergone 1 treatment of hemodialysis.  An outpatient plan for dialysis is being created for him.  Patient is right-handed.  He has undergone right rotator cuff surgery.  This was complicated by 2 episodes of what sounds like asystole, per the patient and his family.  He is understandably nervous about undergoing another anesthetic and surgery.  He has never had any other surgery on his upper extremities.  He has never had a pacemaker.  He has never had a central venous catheter prior to his tunneled dialysis catheter.  Past Medical History:  Diagnosis Date   Diabetes mellitus without complication ([place])    Hypertension     Past Surgical History:  Procedure Laterality Date   HERNIA REPAIR     IR FLUORO GUIDE CV LINE RIGHT  07/17/2021   IR US GUIDE VASC ACCESS RIGHT  07/17/2021   ROTATOR CUFF REPAIR Right     Family History  Problem Relation Age of Onset   CAD Mother    Hypertension Sister    Renal Disease Sister     Social History   Socioeconomic History   Marital status: Married    Spouse name: Not on file   Number of children: Not on file   Years of education: Not on file   Highest education level: Not on file  Occupational History   Not on file  Tobacco Use   Smoking status: Never   Smokeless tobacco: Never  Vaping Use    Vaping Use: Never used  Substance and Sexual Activity   Alcohol use: Never   Drug use: Never   Sexual activity: Not on file  Other Topics Concern   Not on file  Social History Narrative   Not on file   Social Determinants of Health   Financial Resource Strain: Not on file  Food Insecurity: Not on file  Transportation Needs: Not on file  Physical Activity: Not on file  Stress: Not on file  Social Connections: Not on file  Intimate Partner Violence: Not on file    Allergies  Allergen Reactions   Amlodipine Swelling and Other (See Comments)    Swelling of lower limbs    Current Facility-Administered Medications  Medication Dose Route Frequency Provider Last Rate Last Admin   0.9 %  sodium chloride infusion  250 mL Intravenous PRN Doutova, Anastassia, MD       acetaminophen (TYLENOL) tablet 650 mg  650 mg Oral Q6H PRN Doutova, Anastassia, MD       Or   acetaminophen (TYLENOL) suppository 650 mg  650 mg Rectal Q6H PRN Doutova, Anastassia, MD       albuterol (PROVENTIL) (2.5 MG/3ML) 0.083% nebulizer solution 2.5 mg  2.5 mg Nebulization Q2H PRN Doutova, Anastassia, MD       atorvastatin (LIPITOR) tablet 10 mg  10 mg Oral QHS Doutova, Anastassia,  MD   10 mg at 07/18/21 2236   carvedilol (COREG) tablet 25 mg  25 mg Oral BID WC Toy Baker, MD   25 mg at 07/18/21 1635   Chlorhexidine Gluconate Cloth 2 % PADS 6 each  6 each Topical Q0600 Claudia Desanctis, MD       [START ON 07/25/2021] Darbepoetin Alfa (ARANESP) injection 100 mcg  100 mcg Intravenous Q Thu-HD Hammons, Kimberly B, RPH       feeding supplement (NEPRO CARB STEADY) liquid 237 mL  237 mL Oral Q24H Vann, Jessica U, DO   237 mL at 07/18/21 1639   furosemide (LASIX) injection 80 mg  80 mg Intravenous Daily Claudia Desanctis, MD   80 mg at 07/18/21 1112   hydrALAZINE (APRESOLINE) injection 10 mg  10 mg Intravenous Q6H PRN Eulogio Bear U, DO       HYDROcodone-acetaminophen (NORCO/VICODIN) 5-325 MG per tablet 1-2 tablet  1-2  tablet Oral Q4H PRN Toy Baker, MD   1 tablet at 07/17/21 1730   insulin aspart (novoLOG) injection 0-5 Units  0-5 Units Subcutaneous QHS Eulogio Bear U, DO   2 Units at 07/18/21 2234   insulin aspart (novoLOG) injection 0-9 Units  0-9 Units Subcutaneous TID WC Eulogio Bear U, DO   2 Units at 07/18/21 1635   labetalol (NORMODYNE) injection 5 mg  5 mg Intravenous Q2H PRN Eulogio Bear U, DO   5 mg at 07/18/21 1201   losartan (COZAAR) tablet 100 mg  100 mg Oral QHS Vann, Jessica U, DO   100 mg at 07/18/21 2236   mometasone-formoterol (DULERA) 200-5 MCG/ACT inhaler 2 puff  2 puff Inhalation BID Toy Baker, MD   2 puff at 07/18/21 1956   multivitamin (RENA-VIT) tablet 1 tablet  1 tablet Oral QHS Eulogio Bear U, DO   1 tablet at 07/18/21 2236   ondansetron (ZOFRAN) injection 4 mg  4 mg Intravenous Q6H PRN Opyd, Ilene Qua, MD   4 mg at 07/17/21 2224   sevelamer carbonate (RENVELA) tablet 800 mg  800 mg Oral TID WC Claudia Desanctis, MD   800 mg at 07/18/21 1635   sodium chloride flush (NS) 0.9 % injection 3 mL  3 mL Intravenous Q12H Toy Baker, MD   3 mL at 07/18/21 2238   sodium chloride flush (NS) 0.9 % injection 3 mL  3 mL Intravenous PRN Toy Baker, MD        REVIEW OF SYSTEMS:  [X]  denotes positive finding, [ ]  denotes negative finding Cardiac  Comments:  Chest pain or chest pressure:    Shortness of breath upon exertion:    Short of breath when lying flat:    Irregular heart rhythm:        Vascular    Pain in calf, thigh, or hip brought on by ambulation:    Pain in feet at night that wakes you up from your sleep:     Blood clot in your veins:    Leg swelling:         Pulmonary    Oxygen at home:    Productive cough:     Wheezing:         Neurologic    Sudden weakness in arms or legs:     Sudden numbness in arms or legs:     Sudden onset of difficulty speaking or slurred speech:    Temporary loss of vision in one eye:     Problems with  dizziness:  Gastrointestinal    Blood in stool:     Vomited blood:         Genitourinary    Burning when urinating:     Blood in urine:        Psychiatric    Major depression:         Hematologic    Bleeding problems:    Problems with blood clotting too easily:        Skin    Rashes or ulcers:        Constitutional    Fever or chills:      PHYSICAL EXAM Vitals:   07/18/21 1900 07/18/21 2346 07/19/21 0332 07/19/21 0624  BP: (!) 173/83 (!) 173/87 (!) 158/87   Pulse: 77 85 74   Resp: 18 19 16    Temp: 98.1 F (36.7 C) 98.1 F (36.7 C) 98.2 F (36.8 C)   TempSrc: Oral Oral Oral   SpO2: 93% 98% 99%   Weight:    128 kg  Height:        Constitutional: well appearing. no distress. Appears well nourished.  Neurologic: CN intact. no focal findings. no sensory loss. Psychiatric:  Mood and affect symmetric and appropriate. Eyes:  No icterus. No conjunctival pallor. Ears, nose, throat:  mucous membranes moist. Midline trachea.  Cardiac: regular rate and rhythm.  Respiratory:  unlabored. Abdominal:  soft, non-tender, non-distended.  Peripheral vascular: 2+ radial pulses Extremity: no edema. no cyanosis. No pallor.  Skin: no gangrene. no ulceration.  Lymphatic: no Stemmer's sign. no palpable lymphadenopathy.  PERTINENT LABORATORY AND RADIOLOGIC DATA  Most recent CBC CBC Latest Ref Rng & Units 07/19/2021 07/18/2021 07/17/2021  WBC 4.0 - 10.5 K/uL 6.4 7.7 6.0  Hemoglobin 13.0 - 17.0 g/dL 8.1(L) 8.5(L) 8.1(L)  Hematocrit 39.0 - 52.0 % 24.6(L) 26.6(L) 24.6(L)  Platelets 150 - 400 K/uL 179 197 190     Most recent CMP CMP Latest Ref Rng & Units 07/19/2021 07/18/2021 07/17/2021  Glucose 70 - 99 mg/dL 154(H) 194(H) 98  BUN 8 - 23 mg/dL 92(H) 115(H) 110(H)  Creatinine 0.61 - 1.24 mg/dL 13.44(H) 16.22(H) 16.12(H)  Sodium 135 - 145 mmol/L 141 142 142  Potassium 3.5 - 5.1 mmol/L 4.3 5.1 4.6  Chloride 98 - 111 mmol/L 108 111 111  CO2 22 - 32 mmol/L 21(L) 16(L) 17(L)  Calcium  8.9 - 10.3 mg/dL 7.2(L) 7.0(L) 7.2(L)  Total Protein 6.5 - 8.1 g/dL - - -  Total Bilirubin 0.3 - 1.2 mg/dL - - -  Alkaline Phos 38 - 126 U/L - - -  AST 15 - 41 U/L - - -  ALT 0 - 44 U/L - - -    Renal function Estimated Creatinine Clearance: 7.1 mL/min (A) (by C-G formula based on SCr of 13.44 mg/dL (H)).  Hgb A1c MFr Bld (%)  Date Value  07/16/2021 5.0   Vascular Imaging: Vein mapping pending  Yevonne Aline. Stanford Breed, MD [facility] Phone Number: [phone] 07/19/2021 8:10 AM  Total time spent on preparing this encounter including chart review, data review, collecting history, examining the patient, coordinating care for this new patient, 45 minutes.  Portions of this report may have been transcribed using voice recognition software.  Every effort has been made to ensure accuracy; however, inadvertent computerized transcription errors may still be present.

## 2021-07-19 NOTE — Progress Notes (Signed)
Inpatient Diabetes Program Recommendations  AACE/ADA: New Consensus Statement on Inpatient Glycemic Control (2015)  Target Ranges:  Prepandial:   less than 140 mg/dL      Peak postprandial:   less than 180 mg/dL (1-2 hours)      Critically ill patients:  140 - 180 mg/dL   Lab Results  Component Value Date   GLUCAP 110 (H) 07/19/2021   HGBA1C 5.0 07/16/2021    Review of Glycemic Control  Diabetes history: type 2 Outpatient Diabetes medications: glucotrol 5 mg BID Current orders for Inpatient glycemic control: Novolog SENSITIVE correction scale TID & 0-5 units HS scale  Inpatient Diabetes Program Recommendations:   Received diabetes coordinator consult. New HD patient with dialysis catheter inserted.  Recommend discontinuing the Glucotrol at discharge.  Noted that HgbA1C is 5%. Will need to follow up with PCP after discharge for glucose control.   Harvel Ricks RN BSN CDE Diabetes Coordinator Pager: [phone]  8am-5pm

## 2021-07-19 NOTE — Progress Notes (Signed)
Bilateral upper extremity vein mapping completed. Refer to "CV Proc" under chart review to view preliminary results.  07/19/2021 2:06 PM Kelby Aline., MHA, RVT, RDCS, RDMS

## 2021-07-19 NOTE — Progress Notes (Signed)
Met with pt and pt's wife at bedside to discuss pt's out-pt HD schedule. Pt has been approved for a MWF (pt's preference) at [facility] but pt now interested in TTS at [facility] (clinic closest to pt's home). Contacted [facility] admissions for pt to be re-clipped to [facility] (11:25 chair time). Pt aware of this chair time and agreeable to proceed. Pt has no interest in TCU program. Pt advised that [facility] will not be able to approve pt until Monday due to appropriate staff have left for the day. Pt and pt's wife voices understanding. Contacted nephrologist to provide update and awaiting a return call. Will assist as needed.   Melven Sartorius Renal Navigator [phone]

## 2021-07-19 NOTE — Progress Notes (Signed)
PT Cancellation Note  Patient Details Name: Marcus [name] MRN: [id number] DOB: 1955-07-13   Cancelled Treatment:    Reason Eval/Treat Not Completed: Patient at procedure or test/unavailable Pt off floor at HD. Will follow.   Marguarite Arbour A Amias Hutchinson 07/19/2021, 9:59 AM Marisa Severin, PT, DPT Acute Rehabilitation Services Pager [phone] Office [phone]

## 2021-07-19 NOTE — Progress Notes (Signed)
RN went into room. Pt siting chair. RN asked if he needed anything. Pt stated no. Patient then asked for new gown stated he had fell and urinated all over the gown he had on. RN assessed patient. Alert and oriented. No injury. Per patient felt no pain. MD and family notified.

## 2021-07-19 NOTE — Plan of Care (Signed)
Problem: Education: Goal: Knowledge of General Education information will improve Description: Including pain rating scale, medication(s)/side effects and non-pharmacologic comfort measures Outcome: Progressing   Problem: Health Behavior/Discharge Planning: Goal: Ability to manage health-related needs will improve Outcome: Progressing   Problem: Clinical Measurements: Goal: Ability to maintain clinical measurements within normal limits will improve Outcome: Progressing Goal: Will remain free from infection Outcome: Progressing Goal: Diagnostic test results will improve Outcome: Progressing Goal: Respiratory complications will improve Outcome: Progressing Goal: Cardiovascular complication will be avoided Outcome: Progressing   Problem: Activity: Goal: Risk for activity intolerance will decrease Outcome: Progressing   Problem: Nutrition: Goal: Adequate nutrition will be maintained Outcome: Progressing   Problem: Coping: Goal: Level of anxiety will decrease Outcome: Progressing   Problem: Elimination: Goal: Will not experience complications related to bowel motility Outcome: Progressing Goal: Will not experience complications related to urinary retention Outcome: Progressing   Problem: Pain Managment: Goal: General experience of comfort will improve Outcome: Progressing   Problem: Safety: Goal: Ability to remain free from injury will improve Outcome: Progressing   Problem: Skin Integrity: Goal: Risk for impaired skin integrity will decrease Outcome: Progressing   Problem: Education: Goal: Knowledge of General Education information will improve Description: Including pain rating scale, medication(s)/side effects and non-pharmacologic comfort measures Outcome: Progressing   Problem: Health Behavior/Discharge Planning: Goal: Ability to manage health-related needs will improve Outcome: Progressing   Problem: Clinical Measurements: Goal: Ability to maintain  clinical measurements within normal limits will improve Outcome: Progressing Goal: Will remain free from infection Outcome: Progressing Goal: Diagnostic test results will improve Outcome: Progressing Goal: Respiratory complications will improve Outcome: Progressing Goal: Cardiovascular complication will be avoided Outcome: Progressing   Problem: Activity: Goal: Risk for activity intolerance will decrease Outcome: Progressing   Problem: Nutrition: Goal: Adequate nutrition will be maintained Outcome: Progressing   Problem: Coping: Goal: Level of anxiety will decrease Outcome: Progressing   Problem: Elimination: Goal: Will not experience complications related to bowel motility Outcome: Progressing Goal: Will not experience complications related to urinary retention Outcome: Progressing   Problem: Pain Managment: Goal: General experience of comfort will improve Outcome: Progressing   Problem: Safety: Goal: Ability to remain free from injury will improve Outcome: Progressing   Problem: Skin Integrity: Goal: Risk for impaired skin integrity will decrease Outcome: Progressing   Problem: Education: Goal: Ability to describe self-care measures that may prevent or decrease complications (Diabetes Survival Skills Education) will improve Outcome: Progressing Goal: Individualized Educational Video(s) Outcome: Progressing   Problem: Coping: Goal: Ability to adjust to condition or change in health will improve Outcome: Progressing   Problem: Fluid Volume: Goal: Ability to maintain a balanced intake and output will improve Outcome: Progressing   Problem: Health Behavior/Discharge Planning: Goal: Ability to identify and utilize available resources and services will improve Outcome: Progressing Goal: Ability to manage health-related needs will improve Outcome: Progressing   Problem: Metabolic: Goal: Ability to maintain appropriate glucose levels will improve Outcome:  Progressing   Problem: Nutritional: Goal: Maintenance of adequate nutrition will improve Outcome: Progressing Goal: Progress toward achieving an optimal weight will improve Outcome: Progressing   Problem: Skin Integrity: Goal: Risk for impaired skin integrity will decrease Outcome: Progressing   Problem: Tissue Perfusion: Goal: Adequacy of tissue perfusion will improve Outcome: Progressing   Problem: Education: Goal: Knowledge of disease and its progression will improve Outcome: Progressing Goal: Individualized Educational Video(s) Outcome: Progressing   Problem: Fluid Volume: Goal: Compliance with measures to maintain balanced fluid volume will improve Outcome: Progressing   Problem:  Health Behavior/Discharge Planning: Goal: Ability to manage health-related needs will improve Outcome: Progressing

## 2021-07-19 NOTE — Progress Notes (Signed)
Rounded on patient today in correlation to transition to outpatient HD. Patient and patient's wife at the bedside.Ordered Kidney Failure book. Patient educated at the bedside regarding care of tunneled dialysis catheter, AV fistula/graft site care, assessment of thrill daily and proper medication administration on HD days.  Patient reports he spoke with Dr. Luan Pulling this am about permanent placement and feels more comfortable. Patient also educated on the importance of adhering to scheduled dialysis treatments, the effects of fluid overload, hyperkalemia and hyperphosphatemia. Patient capable of re-verbalizing via teach back method. Patient also with inquiries about how much fluid had been removed with treatment. Reviewed patients's treatment with him from documentation. Also educated patient on services available through the interdisciplinary team in the clinic setting. Patient with no further questions at this time. Handouts and contact information provided to patient for any further assistance. Will follow as appropriate.   Dorthey Sawyer, RN  Dialysis Nurse Coordinator Phone: [phone]

## 2021-07-19 NOTE — Progress Notes (Signed)
[facility] Progress Note  Name: Marcus [name] MRN: [id number] DOB: 08-Nov-1955   Subjective:  we discussed need for AVF on 2/9 and he had wanted to speak with his wife.  First HD on 2/9 with 0.5 kg UF.  He had 1.4 liters UOP over 2/9.  He does agree that he needs an AVF.  He is willing to talk to vascular about an AVF.  He has been frustrated that this has been a lot to adjust to.    Review of systems:  Denies shortness of breath or chest pain  Denies n/v   Intake/Output Summary (Last 24 hours) at 07/19/2021 0616 Last data filed at 07/19/2021 0332 Gross per 24 hour  Intake --  Output 1875 ml  Net -1875 ml    Vitals:  Vitals:   07/18/21 1607 07/18/21 1900 07/18/21 2346 07/19/21 0332  BP: (!) 193/85 (!) 173/83 (!) 173/87 (!) 158/87  Pulse: 83 77 85 74  Resp: 16 18 19 16   Temp: 98.1 F (36.7 C) 98.1 F (36.7 C) 98.1 F (36.7 C) 98.2 F (36.8 C)  TempSrc: Oral Oral Oral Oral  SpO2: 94% 93% 98% 99%  Weight:      Height:         Physical Exam:   General adult male in bed in no acute distress HEENT normocephalic atraumatic extraocular movements intact sclera anicteric Neck supple trachea midline Lungs clear to auscultation bilaterally normal work of breathing at rest on supp oxygen Heart S1S2 no rub Abdomen soft nontender nondistended Extremities 1+ edema lower extremities Psych normal mood and affect Neuro - alert and oriented x 3 provides hx and follows commands  Access: RIJ tunn catheter in place   Medications reviewed   Labs:  BMP Latest Ref Rng & Units 07/19/2021 07/18/2021 07/17/2021  Glucose 70 - 99 mg/dL 154(H) 194(H) 98  BUN 8 - 23 mg/dL 92(H) 115(H) 110(H)  Creatinine 0.61 - 1.24 mg/dL 13.44(H) 16.22(H) 16.12(H)  Sodium 135 - 145 mmol/L 141 142 142  Potassium 3.5 - 5.1 mmol/L 4.3 5.1 4.6  Chloride 98 - 111 mmol/L 108 111 111  CO2 22 - 32 mmol/L 21(L) 16(L) 17(L)  Calcium 8.9 - 10.3 mg/dL 7.2(L) 7.0(L) 7.2(L)      Assessment/Plan:   # New ESRD associated with volume overload and uremic symptoms:  - Likely due to hypertension and diabetes.  UA with proteinuria probably due to diabetes without sediment.  Kidney ultrasound with chronic finding with no obstruction.  s/p Tunn catheter on 2/8 with iR. First HD on 2/9.  - HD today.  Plan for additional tx on 2/11 if staffing permits   - consult vascular surgery for placement of permanent access; vein mapping ordered  - continue lasix - transition to 80 mg daily PO on non-HD days as an outpatient - I have contacted HD SW to initiate CLIP process for outpatient HD unit   #Anion gap metabolic acidosis due to renal failure: now managed with HD   #Anemia of ESRD: Iron saturation 31% and ferritin 222.  aranesp 100 mcg given on 2/9.  Aranesp ordered every Thursday    #CKD-MBD: PTH pending.  hyperphosphatemia improved.  Newly initiated on renvela here   #Hypertension/volume overload: improving.  on lasix IV. As an outpatient will need lasix 80 mg po daily on non-HD days  Dispo - continue inpatient monitoring    Claudia Desanctis, MD 07/19/2021 6:45 AM

## 2021-07-20 DIAGNOSIS — N179 Acute kidney failure, unspecified: Secondary | ICD-10-CM | POA: Diagnosis not present

## 2021-07-20 LAB — RENAL FUNCTION PANEL
Albumin: 3 g/dL — ABNORMAL LOW (ref 3.5–5.0)
Anion gap: 10 (ref 5–15)
BUN: 63 mg/dL — ABNORMAL HIGH (ref 8–23)
CO2: 23 mmol/L (ref 22–32)
Calcium: 7.8 mg/dL — ABNORMAL LOW (ref 8.9–10.3)
Chloride: 103 mmol/L (ref 98–111)
Creatinine, Ser: 11.22 mg/dL — ABNORMAL HIGH (ref 0.61–1.24)
GFR, Estimated: 5 mL/min — ABNORMAL LOW (ref >60)
Glucose, Bld: 275 mg/dL — ABNORMAL HIGH (ref 70–99)
Phosphorus: 4 mg/dL (ref 2.5–4.6)
Potassium: 3.5 mmol/L (ref 3.5–5.1)
Sodium: 136 mmol/L (ref 135–145)

## 2021-07-20 LAB — CBC
HCT: 26.3 % — ABNORMAL LOW (ref 39.0–52.0)
Hemoglobin: 8.6 g/dL — ABNORMAL LOW (ref 13.0–17.0)
MCH: 30.8 pg (ref 26.0–34.0)
MCHC: 32.7 g/dL (ref 30.0–36.0)
MCV: 94.3 fL (ref 80.0–100.0)
Platelets: 169 10*3/uL (ref 150–400)
RBC: 2.79 MIL/uL — ABNORMAL LOW (ref 4.22–5.81)
RDW: 16.8 % — ABNORMAL HIGH (ref 11.5–15.5)
WBC: 6.8 10*3/uL (ref 4.0–10.5)
nRBC: 0 % (ref 0.0–0.2)

## 2021-07-20 LAB — GLUCOSE, CAPILLARY
Glucose-Capillary: 168 mg/dL — ABNORMAL HIGH (ref 70–99)
Glucose-Capillary: 214 mg/dL — ABNORMAL HIGH (ref 70–99)
Glucose-Capillary: 115 mg/dL — ABNORMAL HIGH (ref 70–99)
Glucose-Capillary: 185 mg/dL — ABNORMAL HIGH (ref 70–99)

## 2021-07-20 MED ORDER — CAMPHOR-MENTHOL 0.5-0.5 % EX LOTN
TOPICAL_LOTION | CUTANEOUS | Status: DC | PRN
  Filled 2021-07-20: qty 222

## 2021-07-20 MED ORDER — TRAZODONE HCL 50 MG PO TABS
25.0000 mg | ORAL_TABLET | Freq: Every day | ORAL | Status: DC
  Administered 2021-07-20: 25 mg via ORAL
  Filled 2021-07-20: qty 1

## 2021-07-20 MED ORDER — CALCITRIOL 0.25 MCG PO CAPS: 0.2500 ug | ORAL_CAPSULE | ORAL | Status: DC

## 2021-07-20 MED ORDER — MELATONIN 3 MG PO TABS
3.0000 mg | ORAL_TABLET | Freq: Every day | ORAL | Status: DC
  Administered 2021-07-20 – 2021-07-21 (×2): 3 mg via ORAL
  Filled 2021-07-20 (×2): qty 1

## 2021-07-20 MED ORDER — HEPARIN SODIUM (PORCINE) 1000 UNIT/ML IJ SOLN
INTRAMUSCULAR | Status: AC
  Administered 2021-07-20: 1000 [IU]
  Filled 2021-07-20: qty 4

## 2021-07-20 NOTE — Plan of Care (Signed)
Problem: Education: Goal: Knowledge of General Education information will improve Description: Including pain rating scale, medication(s)/side effects and non-pharmacologic comfort measures Outcome: Progressing   Problem: Health Behavior/Discharge Planning: Goal: Ability to manage health-related needs will improve Outcome: Progressing   Problem: Clinical Measurements: Goal: Ability to maintain clinical measurements within normal limits will improve Outcome: Progressing Goal: Will remain free from infection Outcome: Progressing Goal: Diagnostic test results will improve Outcome: Progressing Goal: Respiratory complications will improve Outcome: Progressing Goal: Cardiovascular complication will be avoided Outcome: Progressing   Problem: Activity: Goal: Risk for activity intolerance will decrease Outcome: Progressing   Problem: Nutrition: Goal: Adequate nutrition will be maintained Outcome: Progressing   Problem: Coping: Goal: Level of anxiety will decrease Outcome: Progressing   Problem: Elimination: Goal: Will not experience complications related to bowel motility Outcome: Progressing Goal: Will not experience complications related to urinary retention Outcome: Progressing   Problem: Pain Managment: Goal: General experience of comfort will improve Outcome: Progressing   Problem: Safety: Goal: Ability to remain free from injury will improve Outcome: Progressing   Problem: Skin Integrity: Goal: Risk for impaired skin integrity will decrease Outcome: Progressing   Problem: Education: Goal: Knowledge of General Education information will improve Description: Including pain rating scale, medication(s)/side effects and non-pharmacologic comfort measures Outcome: Progressing   Problem: Health Behavior/Discharge Planning: Goal: Ability to manage health-related needs will improve Outcome: Progressing   Problem: Clinical Measurements: Goal: Ability to maintain  clinical measurements within normal limits will improve Outcome: Progressing Goal: Will remain free from infection Outcome: Progressing Goal: Diagnostic test results will improve Outcome: Progressing Goal: Respiratory complications will improve Outcome: Progressing Goal: Cardiovascular complication will be avoided Outcome: Progressing   Problem: Activity: Goal: Risk for activity intolerance will decrease Outcome: Progressing   Problem: Nutrition: Goal: Adequate nutrition will be maintained Outcome: Progressing   Problem: Coping: Goal: Level of anxiety will decrease Outcome: Progressing   Problem: Elimination: Goal: Will not experience complications related to bowel motility Outcome: Progressing Goal: Will not experience complications related to urinary retention Outcome: Progressing   Problem: Pain Managment: Goal: General experience of comfort will improve Outcome: Progressing   Problem: Safety: Goal: Ability to remain free from injury will improve Outcome: Progressing   Problem: Skin Integrity: Goal: Risk for impaired skin integrity will decrease Outcome: Progressing   Problem: Education: Goal: Ability to describe self-care measures that may prevent or decrease complications (Diabetes Survival Skills Education) will improve Outcome: Progressing Goal: Individualized Educational Video(s) Outcome: Progressing   Problem: Coping: Goal: Ability to adjust to condition or change in health will improve Outcome: Progressing   Problem: Fluid Volume: Goal: Ability to maintain a balanced intake and output will improve Outcome: Progressing   Problem: Health Behavior/Discharge Planning: Goal: Ability to identify and utilize available resources and services will improve Outcome: Progressing Goal: Ability to manage health-related needs will improve Outcome: Progressing   Problem: Metabolic: Goal: Ability to maintain appropriate glucose levels will improve Outcome:  Progressing   Problem: Nutritional: Goal: Maintenance of adequate nutrition will improve Outcome: Progressing Goal: Progress toward achieving an optimal weight will improve Outcome: Progressing   Problem: Skin Integrity: Goal: Risk for impaired skin integrity will decrease Outcome: Progressing   Problem: Tissue Perfusion: Goal: Adequacy of tissue perfusion will improve Outcome: Progressing   Problem: Education: Goal: Knowledge of disease and its progression will improve Outcome: Progressing Goal: Individualized Educational Video(s) Outcome: Progressing   Problem: Fluid Volume: Goal: Compliance with measures to maintain balanced fluid volume will improve Outcome: Progressing   Problem:  Health Behavior/Discharge Planning: Goal: Ability to manage health-related needs will improve Outcome: Progressing

## 2021-07-20 NOTE — Evaluation (Signed)
Physical Therapy Evaluation Patient Details Name: Marcus [name] MRN: [id number] DOB: [birth year] Today's Date: 07/20/2021  History of Present Illness  Patient is a 66 y/o male who presents on 07/17/21 from the [place] with a chief complaint of abnormal labs with worsening renal function. Admitted with new ESRD associated with volume overload with uremic symptoms likely secondary to DM and HTN. s/p tunneled HD catheter 2/8 with first HD session 2/9. CXR-consistent with CHF. PMH includes HTN, DM, CKD, depression, obesity.  Clinical Impression  PTA, pt lives with his spouse and is independent. Pt presents with decreased functional mobility secondary to generalized weakness, impaired balance, and decreased endurance. Pt ambulating 120 feet with a Rollator at a min assist level. Demonstrated mild left knee buckle with fatigue. BP 187/88 (RN aware), SpO2 98% on RA, HR 74. Pt will benefit from progressive mobility prior to discharge home.     Recommendations for follow up therapy are one component of a multi-disciplinary discharge planning process, led by the attending physician.  Recommendations may be updated based on patient status, additional functional criteria and insurance authorization.  Follow Up Recommendations Home health PT    Assistance Recommended at Discharge PRN  Patient can return home with the following  A little help with walking and/or transfers;A little help with bathing/dressing/bathroom    Equipment Recommendations Rollator (4 wheels);BSC/3in1 (bariatric)  Recommendations for Other Services       Functional Status Assessment Patient has had a recent decline in their functional status and demonstrates the ability to make significant improvements in function in a reasonable and predictable amount of time.     Precautions / Restrictions Precautions Precautions: Fall Restrictions Weight Bearing Restrictions: No      Mobility  Bed Mobility                General bed mobility comments: OOB in chair    Transfers Overall transfer level: Needs assistance Equipment used: None Transfers: Sit to/from Stand Sit to Stand: Min guard                Ambulation/Gait Ambulation/Gait assistance: Min assist Gait Distance (Feet): 120 Feet Assistive device: Rollator (4 wheels) Gait Pattern/deviations: Step-through pattern, Decreased stride length, Decreased weight shift to left Gait velocity: decreased     General Gait Details: Pt with mild left knee buckle with fatigue, requiring ~1-2 brief standing rest breaks, minA for balance  Stairs            Wheelchair Mobility    Modified Rankin (Stroke Patients Only)       Balance Overall balance assessment: Needs assistance Sitting-balance support: Feet supported Sitting balance-Leahy Scale: Good     Standing balance support: Bilateral upper extremity supported Standing balance-Leahy Scale: Poor                               Pertinent Vitals/Pain Pain Assessment Pain Assessment: Faces Faces Pain Scale: Hurts a little bit Pain Location: LLE (chronic) Pain Descriptors / Indicators: Discomfort, Grimacing Pain Intervention(s): Monitored during session    Home Living Family/patient expects to be discharged to:: Private residence Living Arrangements: Spouse/significant other Available Help at Discharge: Family Type of Home: House Home Access: Level entry       Home Layout: Able to live on main level with bedroom/bathroom Home Equipment: Kasandra Knudsen - single point      Prior Function Prior Level of Function : Independent/Modified Independent  Mobility Comments: Retired       Journalist, newspaper        Extremity/Trunk Assessment   Upper Extremity Assessment Upper Extremity Assessment: Overall WFL for tasks assessed    Lower Extremity Assessment Lower Extremity Assessment: RLE deficits/detail;LLE deficits/detail RLE Deficits / Details: Grossly  3+/5, increased edema distally LLE Deficits / Details: Grossly 3+/5 except hip flexion 2/5 (pt reports chronic pain and history of femur fx when younger). Increased edema distally       Communication   Communication: No difficulties  Cognition Arousal/Alertness: Awake/alert Behavior During Therapy: WFL for tasks assessed/performed Overall Cognitive Status: Within Functional Limits for tasks assessed                                          General Comments      Exercises     Assessment/Plan    PT Assessment Patient needs continued PT services  PT Problem List Decreased strength;Decreased activity tolerance;Decreased balance;Decreased mobility       PT Treatment Interventions DME instruction;Gait training;Functional mobility training;Therapeutic activities;Therapeutic exercise;Balance training;Patient/family education    PT Goals (Current goals can be found in the Care Plan section)  Acute Rehab PT Goals Patient Stated Goal: get stronger PT Goal Formulation: With patient Time For Goal Achievement: 08/03/21 Potential to Achieve Goals: Good    Frequency Min 3X/week     Co-evaluation               AM-PAC PT "6 Clicks" Mobility  Outcome Measure Help needed turning from your back to your side while in a flat bed without using bedrails?: None Help needed moving from lying on your back to sitting on the side of a flat bed without using bedrails?: A Little Help needed moving to and from a bed to a chair (including a wheelchair)?: A Little Help needed standing up from a chair using your arms (e.g., wheelchair or bedside chair)?: A Little Help needed to walk in hospital room?: A Little Help needed climbing 3-5 steps with a railing? : A Lot 6 Click Score: 18    End of Session   Activity Tolerance: Patient tolerated treatment well Patient left: in chair;with call bell/phone within reach;with family/visitor present Nurse Communication: Mobility  status PT Visit Diagnosis: Unsteadiness on feet (R26.81);Muscle weakness (generalized) (M62.81);Difficulty in walking, not elsewhere classified (R26.2)    Time: 1607-3710 PT Time Calculation (min) (ACUTE ONLY): 27 min   Charges:   PT Evaluation $PT Eval Moderate Complexity: 1 Mod PT Treatments $Therapeutic Activity: 8-22 mins        Marcus [name], PT, DPT Acute Rehabilitation Services Pager [phone] Office [phone]   Marcus [name] 07/20/2021, 3:45 PM

## 2021-07-20 NOTE — Plan of Care (Signed)
  Problem: Clinical Measurements: Goal: Ability to maintain clinical measurements within normal limits will improve Outcome: Progressing Goal: Will remain free from infection Outcome: Progressing Goal: Respiratory complications will improve Outcome: Progressing Goal: Cardiovascular complication will be avoided Outcome: Progressing   Problem: Activity: Goal: Risk for activity intolerance will decrease Outcome: Progressing   

## 2021-07-20 NOTE — Progress Notes (Signed)
[facility] Progress Note  Name: Marcus [name] MRN: [id number] DOB: [birth year]   Subjective:  Seen and examined on dialysis.  Blood pressure 186/91 and HR 74.  Tolerating goal.  Procedure supervised.  RIJ tunn catheter in use.  He had 1 kg UF with yesterday's HD treatment.  He had 1.2 L uop over 2/10.   Review of systems:  Denies shortness of breath or chest pain  He states that he has been told that sometimes he stops breathing when he sleeps; he states doesn't use cpap but they are "going in that direction"  Denies n/v   Intake/Output Summary (Last 24 hours) at 07/20/2021 1352 Last data filed at 07/20/2021 0800 Gross per 24 hour  Intake 480 ml  Output 1200 ml  Net -720 ml    Vitals:  Vitals:   07/20/21 1238 07/20/21 1241 07/20/21 1300 07/20/21 1330  BP: (!) 188/88 121/89 (!) 203/92 103/67  Pulse: 85 86 82 83  Resp: (!) 21 20    Temp: 97.9 F (36.6 C)     TempSrc: Temporal     SpO2: 98%     Weight:      Height:         Physical Exam:    General adult male in bed in no acute distress HEENT normocephalic atraumatic extraocular movements intact sclera anicteric Neck supple trachea midline Lungs clear to auscultation bilaterally normal work of breathing at rest on room air  Heart S1S2 no rub Abdomen soft nontender nondistended Extremities 1-2+ edema lower extremities Psych normal mood and affect Neuro - alert and oriented x 3 provides hx and follows commands  Access: RIJ tunn catheter in place   Medications reviewed   Labs:  BMP Latest Ref Rng & Units 07/20/2021 07/19/2021 07/18/2021  Glucose 70 - 99 mg/dL 275(H) 154(H) 194(H)  BUN 8 - 23 mg/dL 63(H) 92(H) 115(H)  Creatinine 0.61 - 1.24 mg/dL 11.22(H) 13.44(H) 16.22(H)  Sodium 135 - 145 mmol/L 136 141 142  Potassium 3.5 - 5.1 mmol/L 3.5 4.3 5.1  Chloride 98 - 111 mmol/L 103 108 111  CO2 22 - 32 mmol/L 23 21(L) 16(L)  Calcium 8.9 - 10.3 mg/dL 7.8(L) 7.2(L) 7.0(L)      Assessment/Plan:   # New ESRD associated with volume overload and uremic symptoms:  - Likely due to hypertension and diabetes.  UA with proteinuria probably due to diabetes without sediment.  Kidney ultrasound with chronic finding with no obstruction.  s/p Tunn catheter on 2/8 with iR. First HD on 2/9.  - HD today - For now will have a TTS schedule - consulted vascular surgery for placement of permanent access - appreciate vascular.  They are planning on an outpatient AVF creation per charting - continue lasix.  transition to 80 mg daily PO on non-HD days as an outpatient - I have contacted HD SW to initiate CLIP process for outpatient HD unit - he is awaiting final placement; he had to be re-clipped as he changed his mind about HD days and clinic location   #Anion gap metabolic acidosis due to renal failure: now managed with HD   #Anemia of ESRD: Iron saturation 31% and ferritin 222.  aranesp 100 mcg given on 2/9.  Aranesp ordered every Thursday    #CKD-MBD: PTH elevated at 405.  Start calcitriol 0.25 mcg three times a week with HD.  hyperphosphatemia improved.  Newly initiated on renvela here   #Hypertension/volume overload: improving.  on lasix IV. As an outpatient will  need lasix 80 mg po daily on non-HD days  Dispo - continue inpatient monitoring while awaiting an outpatient HD unit  Claudia Desanctis, MD 07/20/2021 2:08 PM

## 2021-07-20 NOTE — Progress Notes (Signed)
Progress Note    Marcus Kyreese Chio  KJZ:[id number] DOB: Sep 20, 1955  DOA: 07/16/2021 PCP: System, Provider Not In    Brief Narrative:     Medical records reviewed and are as summarized below:  [place] is a 66 y.o. male with medical history significant of chronic kidney disease stage V, hypertension hyperlipidemia diabetes type 2.  Admitted for  acute on chronic worsening renal function with evidence of fluid overload. First HD session on 2/9, 2nd session on 2/10.  Getting vein mapping today and needs to be  CLIPPED prior to d/c   Assessment/Plan:   Principal Problem:   AKI (acute kidney injury) ([place]) Active Problems:   Uncontrolled type 2 diabetes mellitus with hypoglycemia, without long-term current use of insulin (HCC)   Essential hypertension   Anemia   Obesity, Class III, BMI 40-49.9 (morbid obesity) ([place])   Cardiomegaly   Hyperkalemia   Uncontrolled type 2 diabetes mellitus with hypoglycemia, without long-term current use of insulin (HCC)  -SSI -d/c glipize upon d/c   New ESRD associated with volume overload and uremic symptoms:  -due to hypertension and diabetes.  UA with proteinuria probably due to diabetes without sediment.  Kidney ultrasound with chronic finding with no obstruction.  Patient received IV diuretics in the ER without much response.   Consult IR to place tunneled HD catheter Per renal: First HD ordered 2/9 and serial HD for 3 days if schedule permits  Essential hypertension -resume home meds and adjust for better control   Anemia S/p 1 unit PRBC per Dr. Roel Cluck   Obesity, Class III, BMI 40-49.9 (morbid obesity) ([place]) Estimated body mass index is 44.76 kg/m as calculated from the following:   Height as of this encounter: 5\' 6"  (1.676 m).   Weight as of this encounter: 125.8 kg.    Cardiomegaly Patient endorses orthopnea in the setting of fluid overload in the setting of severe chronic kidney disease    Hyperkalemia -lokelma x 1 -resolved with HD     Family Communication/Anticipated D/C date and plan/Code Status    Code Status: Full Code.  Family Communication: wife at bedside Disposition Plan: Status is: Inpatient Remains inpatient appropriate because: needs HD             Medical Consultants:   Renal vascular    Subjective:   Not sleeping well  Objective:    Vitals:   07/20/21 1235 07/20/21 1238 07/20/21 1241 07/20/21 1300  BP:  (!) 188/88 121/89 (!) 203/92  Pulse:  85 86 82  Resp:  (!) 21 20   Temp:  97.9 F (36.6 C)    TempSrc:  Temporal    SpO2:  98%    Weight: 125.8 kg     Height:        Intake/Output Summary (Last 24 hours) at 07/20/2021 1332 Last data filed at 07/20/2021 0800 Gross per 24 hour  Intake 480 ml  Output 1200 ml  Net -720 ml   Filed Weights   07/19/21 1230 07/20/21 0538 07/20/21 1235  Weight: 127.8 kg 126.1 kg 125.8 kg    Exam:   General: Appearance:    Severely obese male in no acute distress     Lungs:      respirations unlabored  Heart:    Normal heart rate.    MS:   All extremities are intact.    Neurologic:   Awake, alert, oriented x 3. No apparent focal neurological  defect.            Data Reviewed:   I have personally reviewed following labs and imaging studies:  Labs: Labs show the following:   Basic Metabolic Panel: Recent Labs  Lab 07/16/21 2347 07/16/21 2351 07/17/21 0319 07/17/21 0408 07/17/21 0728 07/18/21 0834 07/19/21 0118 07/20/21 1132  NA 142   < > 143 143 142 142 141 136  K 4.9   < > 4.7 4.7 4.6 5.1 4.3 3.5  CL 114*   < > 110  --  111 111 108 103  CO2 16*  --  16*  --  17* 16* 21* 23  GLUCOSE 147*   < > 141*  --  98 194* 154* 275*  BUN 112*   < > 111*  --  110* 115* 92* 63*  CREATININE 16.04*   < > 16.10*  --  16.12* 16.22* 13.44* 11.22*  CALCIUM 7.1*  --  7.2*  --  7.2* 7.0* 7.2* 7.8*  MG 1.6*  --  1.6*  --   --   --   --   --   PHOS 7.5*  --  7.8*  --  8.2* 8.9*  6.3* 4.0   < > = values in this interval not displayed.   GFR Estimated Creatinine Clearance: 8.4 mL/min (A) (by C-G formula based on SCr of 11.22 mg/dL (H)). Liver Function Tests: Recent Labs  Lab 07/16/21 1303 07/16/21 2347 07/17/21 0319 07/17/21 0728 07/18/21 0834 07/19/21 0118 07/20/21 1132  AST 14* 10* 12*  --   --   --   --   ALT 14 15 15   --   --   --   --   ALKPHOS 48 43 43  --   --   --   --   BILITOT 0.3 0.3 0.2*  --   --   --   --   PROT 6.4* 6.0* 6.1*  --   --   --   --   ALBUMIN 3.0* 2.8* 2.8* 2.8* 2.9* 2.7* 3.0*   No results for input(s): LIPASE, AMYLASE in the last 168 hours. No results for input(s): AMMONIA in the last 168 hours. Coagulation profile No results for input(s): INR, PROTIME in the last 168 hours.  CBC: Recent Labs  Lab 07/16/21 1303 07/16/21 1314 07/16/21 2347 07/16/21 2351 07/17/21 0319 07/17/21 0408 07/17/21 0728 07/18/21 0834 07/19/21 0118 07/20/21 1132  WBC 6.3  --  6.1  --  5.8  --  6.0 7.7 6.4 6.8  NEUTROABS 4.2  --  4.3  --  3.9  --   --   --   --   --   HGB 7.9*   < > 7.0*   < > 7.1* 7.1* 8.1* 8.5* 8.1* 8.6*  HCT 24.4*   < > 22.3*   < > 22.7* 21.0* 24.6* 26.6* 24.6* 26.3*  MCV 97.2  --  98.7  --  100.0  --  93.9 95.7 93.2 94.3  PLT 188  --  181  --  183  --  190 197 179 169   < > = values in this interval not displayed.   Cardiac Enzymes: Recent Labs  Lab 07/16/21 2347  CKTOTAL 669*   BNP (last 3 results) No results for input(s): PROBNP in the last 8760 hours. CBG: Recent Labs  Lab 07/19/21 0621 07/19/21 1633 07/19/21 2126 07/20/21 0623 07/20/21 1126  GLUCAP 110* 186* 291* 168* 214*   D-Dimer: No results for input(s): DDIMER  in the last 72 hours. Hgb A1c: No results for input(s): HGBA1C in the last 72 hours.  Lipid Profile: No results for input(s): CHOL, HDL, LDLCALC, TRIG, CHOLHDL, LDLDIRECT in the last 72 hours. Thyroid function studies: No results for input(s): TSH, T4TOTAL, T3FREE, THYROIDAB in the last  72 hours.  Invalid input(s): FREET3  Anemia work up: No results for input(s): VITAMINB12, FOLATE, FERRITIN, TIBC, IRON, RETICCTPCT in the last 72 hours.  Sepsis Labs: Recent Labs  Lab 07/17/21 0728 07/18/21 0834 07/19/21 0118 07/20/21 1132  WBC 6.0 7.7 6.4 6.8    Microbiology Recent Results (from the past 240 hour(s))  Resp Panel by RT-PCR (Flu A&B, Covid) Nasopharyngeal Swab     Status: None   Collection Time: 07/16/21  9:41 PM   Specimen: Nasopharyngeal Swab; Nasopharyngeal(NP) swabs in vial transport medium  Result Value Ref Range Status   SARS Coronavirus 2 by RT PCR NEGATIVE NEGATIVE Final    Comment: (NOTE) SARS-CoV-2 target nucleic acids are NOT DETECTED.  The SARS-CoV-2 RNA is generally detectable in upper respiratory specimens during the acute phase of infection. The lowest concentration of SARS-CoV-2 viral copies this assay can detect is 138 copies/mL. A negative result does not preclude SARS-Cov-2 infection and should not be used as the sole basis for treatment or other patient management decisions. A negative result may occur with  improper specimen collection/handling, submission of specimen other than nasopharyngeal swab, presence of viral mutation(s) within the areas targeted by this assay, and inadequate number of viral copies(<138 copies/mL). A negative result must be combined with clinical observations, patient history, and epidemiological information. The expected result is Negative.  Fact Sheet for Patients:  EntrepreneurPulse.com.au  Fact Sheet for Healthcare Providers:  IncredibleEmployment.be  This test is no t yet approved or cleared by the [place] FDA and  has been authorized for detection and/or diagnosis of SARS-CoV-2 by FDA under an Emergency Use Authorization (EUA). This EUA will remain  in effect (meaning this test can be used) for the duration of the COVID-19 declaration under Section 564(b)(1)  of the Act, 21 U.S.C.section 360bbb-3(b)(1), unless the authorization is terminated  or revoked sooner.       Influenza A by PCR NEGATIVE NEGATIVE Final   Influenza B by PCR NEGATIVE NEGATIVE Final    Comment: (NOTE) The Xpert Xpress SARS-CoV-2/FLU/RSV plus assay is intended as an aid in the diagnosis of influenza from Nasopharyngeal swab specimens and should not be used as a sole basis for treatment. Nasal washings and aspirates are unacceptable for Xpert Xpress SARS-CoV-2/FLU/RSV testing.  Fact Sheet for Patients: EntrepreneurPulse.com.au  Fact Sheet for Healthcare Providers: IncredibleEmployment.be  This test is not yet approved or cleared by the [place] FDA and has been authorized for detection and/or diagnosis of SARS-CoV-2 by FDA under an Emergency Use Authorization (EUA). This EUA will remain in effect (meaning this test can be used) for the duration of the COVID-19 declaration under Section 564(b)(1) of the Act, 21 U.S.C. section 360bbb-3(b)(1), unless the authorization is terminated or revoked.  Performed at [facility], [place] [address], [place],  [address]   MRSA Next Gen by PCR, Nasal     Status: None   Collection Time: 07/17/21 10:23 PM   Specimen: Nasal Mucosa; Nasal Swab  Result Value Ref Range Status   MRSA by PCR Next Gen NOT DETECTED NOT DETECTED Final    Comment: (NOTE) The GeneXpert MRSA Assay (FDA approved for NASAL specimens only), is one component of a comprehensive MRSA  colonization surveillance program. It is not intended to diagnose MRSA infection nor to guide or monitor treatment for MRSA infections. Test performance is not FDA approved in patients less than 25 years old. Performed at [facility], [place] [address] [place], [place] [address]     Procedures and diagnostic studies:  VAS [place] UPPER EXT VEIN MAPPING (PRE-OP AVF)  Result Date: 07/19/2021 UPPER EXTREMITY VEIN MAPPING  Patient Name:  Marcus [name]  Date of Exam:   07/19/2021 Medical Rec #: [id number]                     Accession #:    [id number] Date of Birth: 06-20-1955                     Patient Gender: M Patient Age:   4 years Exam Location:  [facility] Procedure:      VAS [place] UPPER EXT VEIN MAPPING (PRE-OP AVF) Referring Phys: Lawson Radar --------------------------------------------------------------------------------  Indications: Pre-access. Comparison Study: No prior study Performing Technologist: Maudry Mayhew MHA, RDMS, RVT, RDCS  Examination Guidelines: A complete evaluation includes B-mode imaging, spectral Doppler, color Doppler, and power Doppler as needed of all accessible portions of each vessel. Bilateral testing is considered an integral part of a complete examination. Limited examinations for reoccurring indications may be performed as noted. +-----------------+-------------+----------+--------------+  Right Cephalic    Diameter (cm) Depth (cm)    Findings     +-----------------+-------------+----------+--------------+  Shoulder              0.43         1.62                    +-----------------+-------------+----------+--------------+  Prox upper arm        0.45         1.05                    +-----------------+-------------+----------+--------------+  Mid upper arm         0.46         1.01                    +-----------------+-------------+----------+--------------+  Dist upper arm        0.52         0.69                    +-----------------+-------------+----------+--------------+  Antecubital fossa     0.43         0.24                    +-----------------+-------------+----------+--------------+  Prox forearm          0.30         0.31      branching     +-----------------+-------------+----------+--------------+  Mid forearm           0.19         0.23                    +-----------------+-------------+----------+--------------+  Dist forearm          0.18         0.21                     +-----------------+-------------+----------+--------------+  Wrist  not visualized  +-----------------+-------------+----------+--------------+ +-----------------+-------------+----------+---------+  Left Cephalic     Diameter (cm) Depth (cm) Findings   +-----------------+-------------+----------+---------+  Shoulder              0.45         1.45               +-----------------+-------------+----------+---------+  Prox upper arm        0.47         0.96               +-----------------+-------------+----------+---------+  Mid upper arm         0.51         1.00               +-----------------+-------------+----------+---------+  Dist upper arm        0.42         0.50    branching  +-----------------+-------------+----------+---------+  Antecubital fossa     0.42         0.66               +-----------------+-------------+----------+---------+  Prox forearm          0.38         0.32               +-----------------+-------------+----------+---------+  Mid forearm           0.39         0.66    branching  +-----------------+-------------+----------+---------+  Dist forearm          0.23         0.29               +-----------------+-------------+----------+---------+  Wrist                 0.26         0.29               +-----------------+-------------+----------+---------+ *See table(s) above for measurements and observations.  Diagnosing physician: Jamelle Haring Electronically signed by Jamelle Haring on 07/19/2021 at 7:18:43 PM.    Final     Medications:    atorvastatin  10 mg Oral QHS   carvedilol  25 mg Oral BID WC   Chlorhexidine Gluconate Cloth  6 each Topical Q0600   [START ON 07/25/2021] darbepoetin (ARANESP) injection - DIALYSIS  100 mcg Intravenous Q Thu-HD   feeding supplement (NEPRO CARB STEADY)  237 mL Oral Q24H   furosemide  80 mg Intravenous Daily   insulin aspart  0-5 Units Subcutaneous QHS   insulin aspart  0-9 Units Subcutaneous TID  WC   losartan  50 mg Oral QHS   melatonin  3 mg Oral QHS   mometasone-formoterol  2 puff Inhalation BID   multivitamin  1 tablet Oral QHS   sevelamer carbonate  800 mg Oral TID WC   sodium chloride flush  3 mL Intravenous Q12H   traZODone  25 mg Oral QHS   Continuous Infusions:  sodium chloride       LOS: 4 days   Geradine Girt  [facility]   How to contact the [facility] Attending or Consulting provider [place] or covering provider during after hours [place], for this patient?  Check the care team in [facility] and look for a) attending/consulting [facility] provider listed and b) the [facility] team listed Log into [email or web address] and use [place]'s universal password to access. If you do not have the  password, please contact the hospital operator. Locate the [facility] provider you are looking for under [facility] and page to a number that you can be directly reached. If you still have difficulty reaching the provider, please page the [facility] (Director on Call) for the Hospitalists listed on amion for assistance.  07/20/2021, 1:32 PM

## 2021-07-20 NOTE — Progress Notes (Signed)
Mobility Specialist Progress Note: ° ° 07/20/21 1000  °Mobility  °Activity Ambulated with assistance in hallway  °Level of Assistance Contact guard assist, steadying assist  °Assistive Device Front wheel walker  °Distance Ambulated (ft) 110 ft  °Activity Response Tolerated well  °$Mobility charge 1 Mobility  ° ° °Pre Mobility: HR 79bpm °During Mobility: HR 96bpm °Post Mobility: HR 83bpm ° °Pt required encouragement to participate in mobility session this am. Required minG throughout session for steadying assist, as pt not familiar with RW. Distance limited secondary to fatigue. Pt left back in chair with all needs met.  ° °Marcus [name] °Mobility Specialist  °Phone: [phone] ° °

## 2021-07-21 ENCOUNTER — Encounter (HOSPITAL_COMMUNITY): Payer: Self-pay | Admitting: Internal Medicine

## 2021-07-21 DIAGNOSIS — N179 Acute kidney failure, unspecified: Secondary | ICD-10-CM | POA: Diagnosis not present

## 2021-07-21 LAB — GLUCOSE, CAPILLARY
Glucose-Capillary: 150 mg/dL — ABNORMAL HIGH (ref 70–99)
Glucose-Capillary: 170 mg/dL — ABNORMAL HIGH (ref 70–99)
Glucose-Capillary: 221 mg/dL — ABNORMAL HIGH (ref 70–99)
Glucose-Capillary: 236 mg/dL — ABNORMAL HIGH (ref 70–99)

## 2021-07-21 MED ORDER — TRAZODONE HCL 50 MG PO TABS
50.0000 mg | ORAL_TABLET | Freq: Every day | ORAL | Status: DC
  Administered 2021-07-21: 50 mg via ORAL
  Filled 2021-07-21: qty 1

## 2021-07-21 MED ORDER — LOSARTAN POTASSIUM 50 MG PO TABS
100.0000 mg | ORAL_TABLET | Freq: Every day | ORAL | Status: DC
  Administered 2021-07-21: 100 mg via ORAL
  Filled 2021-07-21: qty 2

## 2021-07-21 MED ORDER — HYDRALAZINE HCL 25 MG PO TABS
25.0000 mg | ORAL_TABLET | Freq: Three times a day (TID) | ORAL | Status: DC
  Administered 2021-07-21 – 2021-07-22 (×4): 25 mg via ORAL
  Filled 2021-07-21 (×4): qty 1

## 2021-07-21 NOTE — Progress Notes (Signed)
Mobility Specialist Progress Note:   07/21/21 1000  Mobility  Activity Ambulated with assistance to bathroom  Level of Assistance Standby assist, set-up cues, supervision of patient - no hands on  Assistive Device None  Distance Ambulated (ft) 30 ft  Activity Response Tolerated fair  $Mobility charge 1 Mobility   Pt c/o increased fatigue this am, d/t no sleep. Up ambulating to BR upon arrival with no AD. Pt with increased steadiness this session. Educated pt to use RW at this time when without assistance. Pt voiced understanding. Pt left sitting up in recliner with all needs met.   Nelta Numbers Mobility Specialist  Phone: [phone]

## 2021-07-21 NOTE — Progress Notes (Signed)
OT Cancellation Note  Patient Details Name: Marcus [name] MRN: [id number] DOB: Sep 02, 1955   Cancelled Treatment:    Reason Eval/Treat Not Completed: Patient declined, no reason specified- returned to see pt.  Patient home setup and PLOF in chart, but patient declines engagement in further ADLs and mobility at this time.  Will follow and see if pt is agreeable.    Jolaine Artist, OT Acute Rehabilitation Services Pager [phone] Office [phone]   Marcus [name] 07/21/2021, 2:09 PM

## 2021-07-21 NOTE — Progress Notes (Signed)
Progress Note    Marcus [name]  XBM:[id number] DOB: Dec 08, 1955  DOA: 07/16/2021 PCP: System, Provider Not In    Brief Narrative:     Medical records reviewed and are as summarized below:  [place] is a 66 y.o. male with medical history significant of chronic kidney disease stage V, hypertension hyperlipidemia diabetes type 2.  Admitted for  acute on chronic worsening renal function with evidence of fluid overload. First HD session on 2/9, 2nd session on 2/10.  Getting vein mapping today and needs to be  CLIPPED prior to d/c. Hope to be able to d/c on Monday.   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  UA with proteinuria probably due to diabetes without sediment.  Kidney ultrasound with chronic finding with no obstruction.  Patient received IV diuretics in the ER without much response.   Consult IR to place tunneled HD catheter Per renal: First HD ordered 2/9 and serial HD for 3 days if schedule permits  Essential hypertension -resume home meds and adjust for better control   Anemia S/p 1 unit PRBC per Dr. Roel Cluck   Obesity, Class III, BMI 40-49.9 (morbid obesity) ([place]) Estimated body mass index is 44.12 kg/m as calculated from the following:   Height as of this encounter: 5\' 6"  (1.676 m).   Weight as of this encounter: 124 kg.    Cardiomegaly Patient endorses orthopnea in the setting of fluid overload in the setting of severe  chronic kidney disease   Hyperkalemia -lokelma x 1 -resolved with HD     Family Communication/Anticipated D/C date and plan/Code Status    Code Status: Full Code.  Family Communication: wife at bedside Disposition Plan: Status is: Inpatient Remains inpatient appropriate because: needs HD- home in AM after CLIPPEd?             Medical Consultants:   Renal vascular    Subjective:   Low dose trazodone did not help with sleep  Objective:    Vitals:   07/21/21 0500 07/21/21 0630 07/21/21 0721 07/21/21 1155  BP: (!) 167/76 (!) 166/80 (!) 188/86 (!) 167/95  Pulse: 79  89 90  Resp: 12  16 18   Temp:   98 F (36.7 C) 98 F (36.7 C)  TempSrc:   Oral Oral  SpO2:   94% 94%  Weight:      Height:        Intake/Output Summary (Last 24 hours) at 07/21/2021 1247 Last data filed at 07/21/2021 0272 Gross per 24 hour  Intake 240 ml  Output 2875 ml  Net -2635 ml   Filed Weights   07/20/21 0538 07/20/21 1235 07/20/21 1533  Weight: 126.1 kg 125.8 kg 124 kg    Exam:    General: Appearance:    Severely obese male in no acute distress     Lungs:      respirations unlabored  Heart:    Normal heart rate.    MS:   All extremities are  intact.    Neurologic:   Awake, alert, oriented x 3             Data Reviewed:   I have personally reviewed following labs and imaging studies:  Labs: Labs show the following:   Basic Metabolic Panel: Recent Labs  Lab 07/16/21 2347 07/16/21 2351 07/17/21 0319 07/17/21 0408 07/17/21 0728 07/18/21 0834 07/19/21 0118 07/20/21 1132  NA 142   < > 143 143 142 142 141 136  K 4.9   < > 4.7 4.7 4.6 5.1 4.3 3.5  CL 114*   < > 110  --  111 111 108 103  CO2 16*  --  16*  --  17* 16* 21* 23  GLUCOSE 147*   < > 141*  --  98 194* 154* 275*  BUN 112*   < > 111*  --  110* 115* 92* 63*  CREATININE 16.04*   < > 16.10*  --  16.12* 16.22* 13.44* 11.22*  CALCIUM 7.1*  --  7.2*  --  7.2* 7.0* 7.2* 7.8*  MG 1.6*  --  1.6*  --   --    --   --   --   PHOS 7.5*  --  7.8*  --  8.2* 8.9* 6.3* 4.0   < > = values in this interval not displayed.   GFR Estimated Creatinine Clearance: 8.4 mL/min (A) (by C-G formula based on SCr of 11.22 mg/dL (H)). Liver Function Tests: Recent Labs  Lab 07/16/21 1303 07/16/21 2347 07/17/21 0319 07/17/21 0728 07/18/21 0834 07/19/21 0118 07/20/21 1132  AST 14* 10* 12*  --   --   --   --   ALT 14 15 15   --   --   --   --   ALKPHOS 48 43 43  --   --   --   --   BILITOT 0.3 0.3 0.2*  --   --   --   --   PROT 6.4* 6.0* 6.1*  --   --   --   --   ALBUMIN 3.0* 2.8* 2.8* 2.8* 2.9* 2.7* 3.0*   No results for input(s): LIPASE, AMYLASE in the last 168 hours. No results for input(s): AMMONIA in the last 168 hours. Coagulation profile No results for input(s): INR, PROTIME in the last 168 hours.  CBC: Recent Labs  Lab 07/16/21 1303 07/16/21 1314 07/16/21 2347 07/16/21 2351 07/17/21 0319 07/17/21 0408 07/17/21 0728 07/18/21 0834 07/19/21 0118 07/20/21 1132  WBC 6.3  --  6.1  --  5.8  --  6.0 7.7 6.4 6.8  NEUTROABS 4.2  --  4.3  --  3.9  --   --   --   --   --   HGB 7.9*   < > 7.0*   < > 7.1* 7.1* 8.1* 8.5* 8.1* 8.6*  HCT 24.4*   < > 22.3*   < > 22.7* 21.0* 24.6* 26.6* 24.6* 26.3*  MCV 97.2  --  98.7  --  100.0  --  93.9 95.7 93.2 94.3  PLT 188  --  181  --  183  --  190 197 179 169   < > = values in this interval not displayed.   Cardiac Enzymes: Recent Labs  Lab 07/16/21 2347  CKTOTAL 669*   BNP (last 3 results) No results for input(s): PROBNP in the last 8760 hours. CBG: Recent Labs  Lab 07/20/21 1126 07/20/21 1619 07/20/21 2116 07/21/21 0604 07/21/21 1113  GLUCAP 214*  115* 185* 150* 170*   D-Dimer: No results for input(s): DDIMER in the last 72 hours. Hgb A1c: No results for input(s): HGBA1C in the last 72 hours.  Lipid Profile: No results for input(s): CHOL, HDL, LDLCALC, TRIG, CHOLHDL, LDLDIRECT in the last 72 hours. Thyroid function studies: No results for  input(s): TSH, T4TOTAL, T3FREE, THYROIDAB in the last 72 hours.  Invalid input(s): FREET3  Anemia work up: No results for input(s): VITAMINB12, FOLATE, FERRITIN, TIBC, IRON, RETICCTPCT in the last 72 hours.  Sepsis Labs: Recent Labs  Lab 07/17/21 0728 07/18/21 0834 07/19/21 0118 07/20/21 1132  WBC 6.0 7.7 6.4 6.8    Microbiology Recent Results (from the past 240 hour(s))  Resp Panel by RT-PCR (Flu A&B, Covid) Nasopharyngeal Swab     Status: None   Collection Time: 07/16/21  9:41 PM   Specimen: Nasopharyngeal Swab; Nasopharyngeal(NP) swabs in vial transport medium  Result Value Ref Range Status   SARS Coronavirus 2 by RT PCR NEGATIVE NEGATIVE Final    Comment: (NOTE) SARS-CoV-2 target nucleic acids are NOT DETECTED.  The SARS-CoV-2 RNA is generally detectable in upper respiratory specimens during the acute phase of infection. The lowest concentration of SARS-CoV-2 viral copies this assay can detect is 138 copies/mL. A negative result does not preclude SARS-Cov-2 infection and should not be used as the sole basis for treatment or other patient management decisions. A negative result may occur with  improper specimen collection/handling, submission of specimen other than nasopharyngeal swab, presence of viral mutation(s) within the areas targeted by this assay, and inadequate number of viral copies(<138 copies/mL). A negative result must be combined with clinical observations, patient history, and epidemiological information. The expected result is Negative.  Fact Sheet for Patients:  EntrepreneurPulse.com.au  Fact Sheet for Healthcare Providers:  IncredibleEmployment.be  This test is no t yet approved or cleared by the [place] FDA and  has been authorized for detection and/or diagnosis of SARS-CoV-2 by FDA under an Emergency Use Authorization (EUA). This EUA will remain  in effect (meaning this test can be used) for the duration  of the COVID-19 declaration under Section 564(b)(1) of the Act, 21 U.S.C.section 360bbb-3(b)(1), unless the authorization is terminated  or revoked sooner.       Influenza A by PCR NEGATIVE NEGATIVE Final   Influenza B by PCR NEGATIVE NEGATIVE Final    Comment: (NOTE) The Xpert Xpress SARS-CoV-2/FLU/RSV plus assay is intended as an aid in the diagnosis of influenza from Nasopharyngeal swab specimens and should not be used as a sole basis for treatment. Nasal washings and aspirates are unacceptable for Xpert Xpress SARS-CoV-2/FLU/RSV testing.  Fact Sheet for Patients: EntrepreneurPulse.com.au  Fact Sheet for Healthcare Providers: IncredibleEmployment.be  This test is not yet approved or cleared by the [place] FDA and has been authorized for detection and/or diagnosis of SARS-CoV-2 by FDA under an Emergency Use Authorization (EUA). This EUA will remain in effect (meaning this test can be used) for the duration of the COVID-19 declaration under Section 564(b)(1) of the Act, 21 U.S.C. section 360bbb-3(b)(1), unless the authorization is terminated or revoked.  Performed at [facility], [place] [address], [place], [place] [address]   MRSA Next Gen by PCR, Nasal     Status: None   Collection Time: 07/17/21 10:23 PM   Specimen: Nasal Mucosa; Nasal Swab  Result Value Ref Range Status   MRSA by PCR Next Gen NOT DETECTED NOT DETECTED Final    Comment: (NOTE) The GeneXpert MRSA Assay (FDA  approved for NASAL specimens only), is one component of a comprehensive MRSA colonization surveillance program. It is not intended to diagnose MRSA infection nor to guide or monitor treatment for MRSA infections. Test performance is not FDA approved in patients less than 11 years old. Performed at [facility], [place] [address], [place], [place] [address]     Procedures and diagnostic studies:  VAS [place] UPPER EXT VEIN MAPPING (PRE-OP  AVF)  Result Date: 07/19/2021 UPPER EXTREMITY VEIN MAPPING Patient Name:  Marcus [name]  Date of Exam:   07/19/2021 Medical Rec #: [id number]                     Accession #:    [id number] Date of Birth: [birth year]                     Patient Gender: M Patient Age:   60 years Exam Location:  [facility] Procedure:      VAS [place] UPPER EXT VEIN MAPPING (PRE-OP AVF) Referring Phys: Lawson Radar --------------------------------------------------------------------------------  Indications: Pre-access. Comparison Study: No prior study Performing Technologist: Maudry Mayhew MHA, RDMS, RVT, RDCS  Examination Guidelines: A complete evaluation includes B-mode imaging, spectral Doppler, color Doppler, and power Doppler as needed of all accessible portions of each vessel. Bilateral testing is considered an integral part of a complete examination. Limited examinations for reoccurring indications may be performed as noted. +-----------------+-------------+----------+--------------+  Right Cephalic    Diameter (cm) Depth (cm)    Findings     +-----------------+-------------+----------+--------------+  Shoulder              0.43         1.62                    +-----------------+-------------+----------+--------------+  Prox upper arm        0.45         1.05                    +-----------------+-------------+----------+--------------+  Mid upper arm         0.46         1.01                    +-----------------+-------------+----------+--------------+  Dist upper arm        0.52         0.69                    +-----------------+-------------+----------+--------------+  Antecubital fossa     0.43         0.24                    +-----------------+-------------+----------+--------------+  Prox forearm          0.30         0.31      branching     +-----------------+-------------+----------+--------------+  Mid forearm           0.19         0.23                     +-----------------+-------------+----------+--------------+  Dist forearm          0.18         0.21                    +-----------------+-------------+----------+--------------+  Wrist  not visualized  +-----------------+-------------+----------+--------------+ +-----------------+-------------+----------+---------+  Left Cephalic     Diameter (cm) Depth (cm) Findings   +-----------------+-------------+----------+---------+  Shoulder              0.45         1.45               +-----------------+-------------+----------+---------+  Prox upper arm        0.47         0.96               +-----------------+-------------+----------+---------+  Mid upper arm         0.51         1.00               +-----------------+-------------+----------+---------+  Dist upper arm        0.42         0.50    branching  +-----------------+-------------+----------+---------+  Antecubital fossa     0.42         0.66               +-----------------+-------------+----------+---------+  Prox forearm          0.38         0.32               +-----------------+-------------+----------+---------+  Mid forearm           0.39         0.66    branching  +-----------------+-------------+----------+---------+  Dist forearm          0.23         0.29               +-----------------+-------------+----------+---------+  Wrist                 0.26         0.29               +-----------------+-------------+----------+---------+ *See table(s) above for measurements and observations.  Diagnosing physician: Jamelle Haring Electronically signed by Jamelle Haring on 07/19/2021 at 7:18:43 PM.    Final     Medications:    atorvastatin  10 mg Oral QHS   [START ON 07/23/2021] calcitRIOL  0.25 mcg Oral Q T,Th,Sa-HD   carvedilol  25 mg Oral BID WC   Chlorhexidine Gluconate Cloth  6 each Topical Q0600   [START ON 07/25/2021] darbepoetin (ARANESP) injection - DIALYSIS  100 mcg Intravenous Q Thu-HD   feeding supplement  (NEPRO CARB STEADY)  237 mL Oral Q24H   furosemide  80 mg Intravenous Daily   insulin aspart  0-5 Units Subcutaneous QHS   insulin aspart  0-9 Units Subcutaneous TID WC   losartan  100 mg Oral QHS   melatonin  3 mg Oral QHS   mometasone-formoterol  2 puff Inhalation BID   multivitamin  1 tablet Oral QHS   sevelamer carbonate  800 mg Oral TID WC   sodium chloride flush  3 mL Intravenous Q12H   traZODone  50 mg Oral QHS   Continuous Infusions:  sodium chloride       LOS: 5 days   Geradine Girt  [facility]   How to contact the [facility] Attending or Consulting provider [place] or covering provider during after hours [place], for this patient?  Check the care team in [facility] and look for a) attending/consulting TRH provider listed and b) the [facility] team listed Log into [email or web address] and use  [place]'s universal password to access. If you do not have the password, please contact the hospital operator. Locate the [facility] provider you are looking for under [facility] and page to a number that you can be directly reached. If you still have difficulty reaching the provider, please page the [facility] (Director on Call) for the Hospitalists listed on amion for assistance.  07/21/2021, 12:47 PM

## 2021-07-21 NOTE — Progress Notes (Signed)
[facility] Progress Note  Name: Marcus [name] MRN: [id number] DOB: May 06, 1956   Subjective:  Last HD on 2/11 with 2 kg UF.  He had 825 mL UOP over 2/11 charted. Spoke with his wife.  She is impressed with how much better he's breathing since starting HD and getting some fluid off; used to be able to hear him in the next room at night.    Review of systems:   reports shortness of breath is improving; denies chest pain  Denies n/v Urinating less    Intake/Output Summary (Last 24 hours) at 07/21/2021 1523 Last data filed at 07/21/2021 0723 Gross per 24 hour  Intake 240 ml  Output 2875 ml  Net -2635 ml    Vitals:  Vitals:   07/21/21 0630 07/21/21 0721 07/21/21 1155 07/21/21 1317  BP: (!) 166/80 (!) 188/86 (!) 167/95   Pulse:  89 90   Resp:  16 18 18   Temp:  98 F (36.7 C) 98 F (36.7 C)   TempSrc:  Oral Oral   SpO2:  94% 94%   Weight:      Height:         Physical Exam:     General adult male in bed in no acute distress HEENT normocephalic atraumatic extraocular movements intact sclera anicteric Neck supple trachea midline Lungs clear to auscultation bilaterally normal work of breathing at rest on room air  Heart S1S2 no rub Abdomen soft nontender nondistended Extremities 1+ edema lower extremities Psych normal mood and affect Neuro - alert and oriented x 3 provides hx and follows commands  Access: RIJ tunn catheter in place   Medications reviewed   Labs:  BMP Latest Ref Rng & Units 07/20/2021 07/19/2021 07/18/2021  Glucose 70 - 99 mg/dL 275(H) 154(H) 194(H)  BUN 8 - 23 mg/dL 63(H) 92(H) 115(H)  Creatinine 0.61 - 1.24 mg/dL 11.22(H) 13.44(H) 16.22(H)  Sodium 135 - 145 mmol/L 136 141 142  Potassium 3.5 - 5.1 mmol/L 3.5 4.3 5.1  Chloride 98 - 111 mmol/L 103 108 111  CO2 22 - 32 mmol/L 23 21(L) 16(L)  Calcium 8.9 - 10.3 mg/dL 7.8(L) 7.2(L) 7.0(L)     Assessment/Plan:   # 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  UA with proteinuria probably due to diabetes without sediment.  Kidney ultrasound with chronic finding with no obstruction.  s/p Tunn catheter on 2/8 with iR. First HD on 2/9.  - TTS schedule  - consulted vascular surgery for placement of permanent access - appreciate vascular.  They are planning on an outpatient AVF creation soon.  - continue lasix.  Will need to transition to 80 mg daily PO on non-HD days as an outpatient - I have contacted HD SW to initiate CLIP process for outpatient HD unit - he is awaiting final placement; he had to be re-clipped as he changed his mind about HD days and clinic location   #Anion gap metabolic acidosis due to renal failure: now managed with HD   #Anemia of ESRD: Iron saturation 31% and ferritin 222.  aranesp 100 mcg given on 2/9.  Aranesp ordered every Thursday    #CKD-MBD: PTH elevated at 405.  Started calcitriol 0.25 mcg three times a week with HD. (Do not include on med rec as he will get at outpatient HD unit).  Hyperphosphatemia improved.  Newly initiated on renvela here   #Hypertension/volume overload: improving.  on lasix IV. As  an outpatient will need lasix 80 mg po daily on non-HD days.  Start hydralazine and may be able to stop with improvement in volume status  Dispo - continue inpatient monitoring while awaiting an outpatient HD unit  Claudia Desanctis, MD 07/21/2021 4:02 PM

## 2021-07-21 NOTE — Progress Notes (Signed)
Pt refused CPAP for the night. Pt stated he will call when he wants to wear it.

## 2021-07-21 NOTE — Progress Notes (Signed)
OT Cancellation Note  Patient Details Name: Domenico Achord MRN: [id number] DOB: 13-Mar-1956   Cancelled Treatment:    Reason Eval/Treat Not Completed: Patient declined, no reason specified- pt politely declined due to fatigue and asking OT to return later today.  OT will follow up as able.   Jolaine Artist, OT Acute Rehabilitation Services Pager [phone] Office [phone]   Delight Stare 07/21/2021, 9:23 AM

## 2021-07-22 ENCOUNTER — Other Ambulatory Visit (HOSPITAL_COMMUNITY): Payer: Self-pay

## 2021-07-22 DIAGNOSIS — N179 Acute kidney failure, unspecified: Secondary | ICD-10-CM | POA: Diagnosis not present

## 2021-07-22 LAB — GLUCOSE, CAPILLARY
Glucose-Capillary: 155 mg/dL — ABNORMAL HIGH (ref 70–99)
Glucose-Capillary: 180 mg/dL — ABNORMAL HIGH (ref 70–99)

## 2021-07-22 MED ORDER — SEVELAMER CARBONATE 800 MG PO TABS: 800.0000 mg | ORAL_TABLET | Freq: Three times a day (TID) | ORAL | 1 refills | Status: DC

## 2021-07-22 MED ORDER — HYDROCODONE-ACETAMINOPHEN 5-325 MG PO TABS
1.0000 | ORAL_TABLET | ORAL | 0 refills | Status: AC | PRN
Start: 2021-07-22 — End: 2021-07-25

## 2021-07-22 MED ORDER — RENA-VITE PO TABS: 1.0000 | ORAL_TABLET | Freq: Every day | ORAL | 0 refills | Status: AC

## 2021-07-22 MED ORDER — SEVELAMER CARBONATE 800 MG PO TABS: 800.0000 mg | ORAL_TABLET | Freq: Three times a day (TID) | ORAL | 1 refills | Status: AC

## 2021-07-22 MED ORDER — HYDRALAZINE HCL 25 MG PO TABS: 25.0000 mg | ORAL_TABLET | Freq: Three times a day (TID) | ORAL | 0 refills | Status: AC

## 2021-07-22 MED ORDER — NEPRO/CARBSTEADY PO LIQD: 237.0000 mL | ORAL | 0 refills | Status: AC

## 2021-07-22 MED ORDER — CHLORHEXIDINE GLUCONATE CLOTH 2 % EX PADS
6.0000 | MEDICATED_PAD | Freq: Every day | CUTANEOUS | Status: DC
  Administered 2021-07-22: 6 via TOPICAL

## 2021-07-22 NOTE — TOC Initial Note (Incomplete Revision)
Transition of Care [facility]) - Initial/Assessment Note    Patient Details  Name: Marcus [name] MRN: [id number] Date of Birth: [birth year]  [facility] CM/SW Contact:    Angelita Ingles, RN Phone Number:[phone]  07/22/2021, 1:50 PM  Clinical Narrative:                 CM at bedside to offer choice for home health needs. CM provided patient with medicare.gov list. [place] referral has been called to Amy with [facility]. Home health info has been added to avs.  1430 patient has DME recommendations. CM has called Cyndee Brightly SW at [place] to make aware of DME needs. Message has been left and CM has paged . Will await return call.   1520 CM has attempted to contact [place] again in reference to DME. Message has been left. CM attempted to update patient but patient does not answer the phone.    Barriers to Discharge: Continued Medical Work up   Patient Goals and CMS Choice        Expected Discharge Plan and Services                                                Prior Living Arrangements/Services                       Activities of Daily Living Home Assistive Devices/Equipment: None ADL Screening (condition at time of admission) Patient's cognitive ability adequate to safely complete daily activities?: Yes Is the patient deaf or have difficulty hearing?: No Does the patient have difficulty seeing, even when wearing glasses/contacts?: No Does the patient have difficulty concentrating, remembering, or making decisions?: No Patient able to express need for assistance with ADLs?: Yes Does the patient have difficulty dressing or bathing?: No Independently performs ADLs?: Yes (appropriate for developmental age) Does the patient have difficulty walking or climbing stairs?: No Weakness of Legs: None Weakness of Arms/Hands: None  Permission Sought/Granted                  Emotional Assessment              Admission  diagnosis:  Hyperkalemia [E87.5] Elevated BUN [R79.9] AKI (acute kidney injury) ([place]) [N17.9] Hypervolemia associated with renal insufficiency [E87.70, N28.9] Patient Active Problem List   Diagnosis Date Noted   AKI (acute kidney injury) ([place]) 07/16/2021   Uncontrolled type 2 diabetes mellitus with hypoglycemia, without long-term current use of insulin ([place]) 07/16/2021   Essential hypertension 07/16/2021   Anemia 07/16/2021   Obesity, Class III, BMI 40-49.9 (morbid obesity) ([place]) 07/16/2021   Cardiomegaly 07/16/2021   Hyperkalemia 07/16/2021   PCP:  System, Provider Not In Pharmacy:   [facility] [place], [place] [address] Phone: [phone] Fax: [phone]  [place], [place] - [place] [address] [place] [address] Phone: [phone] Fax: [phone]     Social Determinants of Health (SDOH) Interventions    Readmission Risk Interventions Readmission Risk Prevention Plan 07/22/2021  Transportation Screening Complete  PCP or Specialist Appt within 5-7 Days Complete  Home Care Screening Complete  Medication Review (RN CM) Referral to Pharmacy

## 2021-07-22 NOTE — Plan of Care (Signed)

## 2021-07-22 NOTE — Progress Notes (Signed)
[facility] NEPHROLOGY PROGRESS NOTE  Assessment/ Plan:  # New ESRD, presented with volume overload and uremic symptoms.  ESRD due to hypertension and diabetes.  UA with proteinuria due to diabetes without sediment.  Kidney ultrasound with chronic finding and has no hydronephrosis.  s/p Tunn catheter on 2/8 with iR. First HD on 2/9. Vascular planning to place AV fistula as outpatient.  Social worker is following outpatient HD arrangement, I will talk to her today.  Continue TTS schedule, next HD tomorrow.  I will DC IV Lasix.  # Anemia of ESRD: Iron saturation 31%.  Continue ESA and monitor hemoglobin.  # Secondary hyperparathyroidism: PTH 405.  Continue calcitriol and Renvela.  Monitor calcium, phosphorus level.  # HTN/volume: Volume management with dialysis.  On losartan, hydralazine and carvedilol.  Subjective: The patient was seen and examined.  He is sitting on chair comfortable.  Denies nausea, vomiting, chest pain or shortness of breath.  Feels much better after initiating dialysis.  His wife at bedside. Objective Vital signs in last 24 hours: Vitals:   07/21/21 2252 07/22/21 0310 07/22/21 0318 07/22/21 0534  BP: 139/79 (!) 154/85    Pulse:   79   Resp: 20 16 12    Temp: 97.8 F (36.6 C) 98.1 F (36.7 C)    TempSrc: Oral Oral    SpO2: 94%  94%   Weight:    124.3 kg  Height:       Weight change: -1.5 kg  Intake/Output Summary (Last 24 hours) at 07/22/2021 0813 Last data filed at 07/22/2021 0747 Gross per 24 hour  Intake 960 ml  Output 550 ml  Net 410 ml       Labs: Basic Metabolic Panel: Recent Labs  Lab 07/18/21 0834 07/19/21 0118 07/20/21 1132  NA 142 141 136  K 5.1 4.3 3.5  CL 111 108 103  CO2 16* 21* 23  GLUCOSE 194* 154* 275*  BUN 115* 92* 63*  CREATININE 16.22* 13.44* 11.22*  CALCIUM 7.0* 7.2* 7.8*  PHOS 8.9* 6.3* 4.0   Liver Function Tests: Recent Labs  Lab 07/16/21 1303 07/16/21 2347 07/17/21 0319 07/17/21 0728 07/18/21 0834  07/19/21 0118 07/20/21 1132  AST 14* 10* 12*  --   --   --   --   ALT 14 15 15   --   --   --   --   ALKPHOS 48 43 43  --   --   --   --   BILITOT 0.3 0.3 0.2*  --   --   --   --   PROT 6.4* 6.0* 6.1*  --   --   --   --   ALBUMIN 3.0* 2.8* 2.8*   < > 2.9* 2.7* 3.0*   < > = values in this interval not displayed.   No results for input(s): LIPASE, AMYLASE in the last 168 hours. No results for input(s): AMMONIA in the last 168 hours. CBC: Recent Labs  Lab 07/16/21 1303 07/16/21 1314 07/16/21 2347 07/16/21 2351 07/17/21 0319 07/17/21 0408 07/17/21 0728 07/18/21 0834 07/19/21 0118 07/20/21 1132  WBC 6.3  --  6.1  --  5.8  --  6.0 7.7 6.4 6.8  NEUTROABS 4.2  --  4.3  --  3.9  --   --   --   --   --   HGB 7.9*   < > 7.0*   < > 7.1*   < > 8.1* 8.5* 8.1* 8.6*  HCT 24.4*   < >  22.3*   < > 22.7*   < > 24.6* 26.6* 24.6* 26.3*  MCV 97.2  --  98.7  --  100.0  --  93.9 95.7 93.2 94.3  PLT 188  --  181  --  183  --  190 197 179 169   < > = values in this interval not displayed.   Cardiac Enzymes: Recent Labs  Lab 07/16/21 2347  CKTOTAL 669*   CBG: Recent Labs  Lab 07/21/21 0604 07/21/21 1113 07/21/21 1647 07/21/21 2127 07/22/21 0635  GLUCAP 150* 170* 221* 236* 155*    Iron Studies: No results for input(s): IRON, TIBC, TRANSFERRIN, FERRITIN in the last 72 hours. Studies/Results: No results found.  Medications: Infusions:  sodium chloride      Scheduled Medications:  atorvastatin  10 mg Oral QHS   [START ON 07/23/2021] calcitRIOL  0.25 mcg Oral Q T,Th,Sa-HD   carvedilol  25 mg Oral BID WC   Chlorhexidine Gluconate Cloth  6 each Topical Q0600   [START ON 07/25/2021] darbepoetin (ARANESP) injection - DIALYSIS  100 mcg Intravenous Q Thu-HD   feeding supplement (NEPRO CARB STEADY)  237 mL Oral Q24H   furosemide  80 mg Intravenous Daily   hydrALAZINE  25 mg Oral Q8H   insulin aspart  0-5 Units Subcutaneous QHS   insulin aspart  0-9 Units Subcutaneous TID WC   losartan   100 mg Oral QHS   melatonin  3 mg Oral QHS   mometasone-formoterol  2 puff Inhalation BID   multivitamin  1 tablet Oral QHS   sevelamer carbonate  800 mg Oral TID WC   sodium chloride flush  3 mL Intravenous Q12H   traZODone  50 mg Oral QHS    have reviewed scheduled and prn medications.  Physical Exam: General:NAD, comfortable Heart:RRR, s1s2 nl Lungs:clear b/l, no crackle Abdomen:soft, Non-tender, non-distended Extremities: Edema is much better, chronic hyper pigmented skin. Dialysis Access: [facility]  Gelena Klosinski Tanna Furry 07/22/2021,8:13 AM  LOS: 6 days

## 2021-07-22 NOTE — Evaluation (Signed)
Occupational Therapy Evaluation Patient Details Name: Marcus [name] MRN: [id number] DOB: 1955/07/27 Today's Date: 07/22/2021   History of Present Illness 66 y/o male who presents on 07/17/21 from the [place] with a chief complaint of abnormal labs with worsening renal function. Admitted with new ESRD associated with volume overload with uremic symptoms likely secondary to DM and HTN. s/p tunneled HD catheter 2/8 with first HD session 2/9. CXR-consistent with CHF. PMH includes HTN, DM, CKD, depression, obesity.   Clinical Impression   PTA, pt was living with his wife and was independent. Currently, pt requires Min Guard A-Max A for LB ADLs and Min Guard A for functional mobility using rollator. Pt presenting with decreased balance, strength, and activity tolerance. However, feel not far from baseline function. VSS on RA. Answered all pt questions in preparation for dc later today. All acute OT needs met. Recommend dc home once medically stable per physician.      Recommendations for follow up therapy are one component of a multi-disciplinary discharge planning process, led by the attending physician.  Recommendations may be updated based on patient status, additional functional criteria and insurance authorization.   Follow Up Recommendations  No OT follow up    Assistance Recommended at Discharge Intermittent Supervision/Assistance  Patient can return home with the following A little help with bathing/dressing/bathroom;Assist for transportation    Functional Status Assessment  Patient has had a recent decline in their functional status and demonstrates the ability to make significant improvements in function in a reasonable and predictable amount of time.  Equipment Recommendations  None recommended by OT    Recommendations for Other Services       Precautions / Restrictions        Mobility Bed Mobility Overal bed mobility: Needs Assistance Bed Mobility: Supine to Sit      Supine to sit: Supervision          Transfers Overall transfer level: Needs assistance Equipment used: None Transfers: Sit to/from Stand Sit to Stand: Min guard           General transfer comment: Min Guard A for safety      Balance Overall balance assessment: Needs assistance Sitting-balance support: No upper extremity supported, Feet supported Sitting balance-Leahy Scale: Good     Standing balance support: No upper extremity supported, During functional activity Standing balance-Leahy Scale: Poor                             ADL either performed or assessed with clinical judgement   ADL Overall ADL's : Needs assistance/impaired Eating/Feeding: Set up;Supervision/ safety;Sitting   Grooming: Set up;Supervision/safety;Sitting   Upper Body Bathing: Set up;Supervision/ safety;Sitting   Lower Body Bathing: Min guard;Sit to/from stand   Upper Body Dressing : Supervision/safety;Set up;Sitting   Lower Body Dressing: Maximal assistance;Bed level Lower Body Dressing Details (indicate cue type and reason): wife donning socks Toilet Transfer: Min guard;Ambulation           Functional mobility during ADLs: Min guard General ADL Comments: Pt with decreased strength, blance, and activity tolernace compared to baseline     Vision         Perception     Praxis      Pertinent Vitals/Pain Pain Assessment Pain Assessment: Faces Faces Pain Scale: Hurts a little bit Pain Location: LLE (chronic) Pain Descriptors / Indicators: Discomfort, Grimacing Pain Intervention(s): Limited activity within patient's tolerance, Monitored during session, Repositioned  Hand Dominance     Extremity/Trunk Assessment Upper Extremity Assessment Upper Extremity Assessment: Overall WFL for tasks assessed   Lower Extremity Assessment Lower Extremity Assessment: Defer to PT evaluation   Cervical / Trunk Assessment Cervical / Trunk Assessment: Normal   Communication  Communication Communication: No difficulties   Cognition Arousal/Alertness: Awake/alert Behavior During Therapy: WFL for tasks assessed/performed Overall Cognitive Status: Within Functional Limits for tasks assessed                                       General Comments  Wife present throughout. VSS on RA    Exercises     Shoulder Instructions      Home Living Family/patient expects to be discharged to:: Private residence Living Arrangements: Spouse/significant other Available Help at Discharge: Family;Available PRN/intermittently Type of Home: House Home Access: Level entry     Home Layout: Two level;Able to live on main level with bedroom/bathroom     Bathroom Shower/Tub: Tub/shower unit;Walk-in shower   Bathroom Toilet: Standard     Home Equipment: Cane - single point          Prior Functioning/Environment Prior Level of Function : Independent/Modified Independent             Mobility Comments: Retired ADLs Comments: drives, limited IADLs        OT Problem List: Decreased activity tolerance;Decreased range of motion;Decreased strength;Impaired balance (sitting and/or standing);Decreased knowledge of precautions;Decreased knowledge of use of DME or AE      OT Treatment/Interventions:      OT Goals(Current goals can be found in the care plan section) Acute Rehab OT Goals Patient Stated Goal: Go home OT Goal Formulation: All assessment and education complete, DC therapy  OT Frequency:      Co-evaluation              AM-PAC OT "6 Clicks" Daily Activity     Outcome Measure Help from another person eating meals?: None Help from another person taking care of personal grooming?: A Little Help from another person toileting, which includes using toliet, bedpan, or urinal?: A Little Help from another person bathing (including washing, rinsing, drying)?: A Little Help from another person to put on and taking off regular upper body  clothing?: None Help from another person to put on and taking off regular lower body clothing?: A Lot 6 Click Score: 19   End of Session Equipment Utilized During Treatment: Rollator (4 wheels) Nurse Communication: Mobility status  Activity Tolerance: Patient tolerated treatment well Patient left: in chair;with call bell/phone within reach;with nursing/sitter in room;Other (comment) (Dr Eliseo Squires)  OT Visit Diagnosis: Unsteadiness on feet (R26.81);Other abnormalities of gait and mobility (R26.89);Muscle weakness (generalized) (M62.81)                Time: 4401-0272 OT Time Calculation (min): 28 min Charges:  OT General Charges $OT Visit: 1 Visit OT Evaluation $OT Eval Low Complexity: 1 Low OT Treatments $Self Care/Home Management : 8-22 mins  Marcus [name] MSOT, OTR/L Acute Rehab Office: [place] 07/22/2021, 2:41 PM

## 2021-07-22 NOTE — Progress Notes (Addendum)
Contacted [facility] and spoke to [place], [place] manager, this am. Clinic aware of pt's interest in TTS and need for approval as soon as possible. Hopefully pt will be approved by this afternoon. Will follow and assist.   Melven Sartorius Renal Navigator [phone]  Addendum at 2:25 pm: Pt has been accepted at [facility] on TTS schedule. Pt needs to arrive at 10:30 for first appt to complete paperwork prior to 11:25 chair time. Pt can start tomorrow (2/14). Met with pt and pt's wife at bedside to provide appt details. Schedule letter provided to pt's wife at bedside. Both agreeable to plan. Contacted attending, nephrologist, RN, and [facility] staff via secure chat to provide update. Pt for possible d/c today and pt aware of this info. Contacted renal PA to request that orders be sent to clinic for tomorrow's treatment. Clinic aware of pt's likely d/c today and to start tomorrow. Out-pt HD arrangements placed on pt's AVS as well.

## 2021-07-22 NOTE — TOC Initial Note (Addendum)
Transition of Care [facility]) - Initial/Assessment Note    Patient Details  Name: Marcus [name] MRN: [id number] Date of Birth: 11-08-1955  [facility] CM/SW Contact:    Angelita Ingles, RN Phone Number:[phone]  07/22/2021, 1:50 PM  Clinical Narrative:                 CM at bedside to offer choice for home health needs. CM provided patient with medicare.gov list. [place] referral has been called to Amy with [facility]. Home health info has been added to avs.  1430 patient has DME recommendations. CM has called Cyndee Brightly SW at [place] to make aware of DME needs. Message has been left and CM has paged . Will await return call.   1520 CM has attempted to contact [facility] again in reference to DME. Message has been left. CM attempted to update patient but patient does not answer the phone.    Barriers to Discharge: Continued Medical Work up   Patient Goals and CMS Choice        Expected Discharge Plan and Services                                                Prior Living Arrangements/Services                       Activities of Daily Living Home Assistive Devices/Equipment: None ADL Screening (condition at time of admission) Patient's cognitive ability adequate to safely complete daily activities?: Yes Is the patient deaf or have difficulty hearing?: No Does the patient have difficulty seeing, even when wearing glasses/contacts?: No Does the patient have difficulty concentrating, remembering, or making decisions?: No Patient able to express need for assistance with ADLs?: Yes Does the patient have difficulty dressing or bathing?: No Independently performs ADLs?: Yes (appropriate for developmental age) Does the patient have difficulty walking or climbing stairs?: No Weakness of Legs: None Weakness of Arms/Hands: None  Permission Sought/Granted                  Emotional Assessment              Admission  diagnosis:  Hyperkalemia [E87.5] Elevated BUN [R79.9] AKI (acute kidney injury) ([place]) [N17.9] Hypervolemia associated with renal insufficiency [E87.70, N28.9] Patient Active Problem List   Diagnosis Date Noted   AKI (acute kidney injury) ([place]) 07/16/2021   Uncontrolled type 2 diabetes mellitus with hypoglycemia, without long-term current use of insulin ([place]) 07/16/2021   Essential hypertension 07/16/2021   Anemia 07/16/2021   Obesity, Class III, BMI 40-49.9 (morbid obesity) ([place]) 07/16/2021   Cardiomegaly 07/16/2021   Hyperkalemia 07/16/2021   PCP:  System, Provider Not In Pharmacy:   [facility] [place], [address] [place] [address] Phone: [phone] Fax: [phone]  [place], [place] - [address] [place] [address] Phone: [phone] Fax: [phone]     Social Determinants of Health (SDOH) Interventions    Readmission Risk Interventions Readmission Risk Prevention Plan 07/22/2021  Transportation Screening Complete  PCP or Specialist Appt within 5-7 Days Complete  Home Care Screening Complete  Medication Review (RN CM) Referral to Pharmacy

## 2021-07-22 NOTE — Plan of Care (Signed)
Problem: Education: Goal: Knowledge of General Education information will improve Description: Including pain rating scale, medication(s)/side effects and non-pharmacologic comfort measures 07/22/2021 1512 by Jae Dire, RN Outcome: Adequate for Discharge 07/22/2021 0756 by Jae Dire, RN Outcome: Progressing   Problem: Health Behavior/Discharge Planning: Goal: Ability to manage health-related needs will improve 07/22/2021 1512 by Jae Dire, RN Outcome: Adequate for Discharge 07/22/2021 0756 by Jae Dire, RN Outcome: Progressing   Problem: Clinical Measurements: Goal: Ability to maintain clinical measurements within normal limits will improve 07/22/2021 1512 by Jae Dire, RN Outcome: Adequate for Discharge 07/22/2021 0756 by Jae Dire, RN Outcome: Progressing Goal: Will remain free from infection 07/22/2021 1512 by Jae Dire, RN Outcome: Adequate for Discharge 07/22/2021 0756 by Jae Dire, RN Outcome: Progressing Goal: Diagnostic test results will improve Outcome: Adequate for Discharge Goal: Respiratory complications will improve Outcome: Adequate for Discharge Goal: Cardiovascular complication will be avoided Outcome: Adequate for Discharge   Problem: Activity: Goal: Risk for activity intolerance will decrease Outcome: Adequate for Discharge   Problem: Nutrition: Goal: Adequate nutrition will be maintained Outcome: Adequate for Discharge   Problem: Coping: Goal: Level of anxiety will decrease Outcome: Adequate for Discharge   Problem: Elimination: Goal: Will not experience complications related to bowel motility Outcome: Adequate for Discharge Goal: Will not experience complications related to urinary retention Outcome: Adequate for Discharge   Problem: Pain Managment: Goal: General experience of comfort will improve Outcome: Adequate for Discharge   Problem: Safety: Goal: Ability to remain free from injury will  improve Outcome: Adequate for Discharge   Problem: Skin Integrity: Goal: Risk for impaired skin integrity will decrease Outcome: Adequate for Discharge   Problem: Education: Goal: Knowledge of General Education information will improve Description: Including pain rating scale, medication(s)/side effects and non-pharmacologic comfort measures Outcome: Adequate for Discharge   Problem: Health Behavior/Discharge Planning: Goal: Ability to manage health-related needs will improve Outcome: Adequate for Discharge   Problem: Clinical Measurements: Goal: Ability to maintain clinical measurements within normal limits will improve Outcome: Adequate for Discharge Goal: Will remain free from infection Outcome: Adequate for Discharge Goal: Diagnostic test results will improve Outcome: Adequate for Discharge Goal: Respiratory complications will improve Outcome: Adequate for Discharge Goal: Cardiovascular complication will be avoided Outcome: Adequate for Discharge   Problem: Activity: Goal: Risk for activity intolerance will decrease Outcome: Adequate for Discharge   Problem: Nutrition: Goal: Adequate nutrition will be maintained Outcome: Adequate for Discharge   Problem: Coping: Goal: Level of anxiety will decrease Outcome: Adequate for Discharge   Problem: Elimination: Goal: Will not experience complications related to bowel motility Outcome: Adequate for Discharge Goal: Will not experience complications related to urinary retention Outcome: Adequate for Discharge   Problem: Pain Managment: Goal: General experience of comfort will improve Outcome: Adequate for Discharge   Problem: Safety: Goal: Ability to remain free from injury will improve Outcome: Adequate for Discharge   Problem: Skin Integrity: Goal: Risk for impaired skin integrity will decrease Outcome: Adequate for Discharge   Problem: Education: Goal: Ability to describe self-care measures that may prevent or  decrease complications (Diabetes Survival Skills Education) will improve Outcome: Adequate for Discharge Goal: Individualized Educational Video(s) Outcome: Adequate for Discharge   Problem: Coping: Goal: Ability to adjust to condition or change in health will improve Outcome: Adequate for Discharge   Problem: Fluid Volume: Goal: Ability to maintain a balanced intake and output will improve Outcome: Adequate for Discharge   Problem: Health Behavior/Discharge Planning: Goal: Ability  to identify and utilize available resources and services will improve Outcome: Adequate for Discharge Goal: Ability to manage health-related needs will improve Outcome: Adequate for Discharge   Problem: Metabolic: Goal: Ability to maintain appropriate glucose levels will improve Outcome: Adequate for Discharge   Problem: Nutritional: Goal: Maintenance of adequate nutrition will improve Outcome: Adequate for Discharge Goal: Progress toward achieving an optimal weight will improve Outcome: Adequate for Discharge   Problem: Skin Integrity: Goal: Risk for impaired skin integrity will decrease Outcome: Adequate for Discharge   Problem: Tissue Perfusion: Goal: Adequacy of tissue perfusion will improve Outcome: Adequate for Discharge   Problem: Education: Goal: Knowledge of disease and its progression will improve Outcome: Adequate for Discharge Goal: Individualized Educational Video(s) Outcome: Adequate for Discharge   Problem: Fluid Volume: Goal: Compliance with measures to maintain balanced fluid volume will improve Outcome: Adequate for Discharge   Problem: Health Behavior/Discharge Planning: Goal: Ability to manage health-related needs will improve Outcome: Adequate for Discharge

## 2021-07-22 NOTE — Discharge Summary (Signed)
Physician Discharge Summary  Marcus [name] CBJ:[id number] DOB: 02-Jul-1955 DOA: 07/16/2021  PCP: Elizbeth Squires, MD- goes to the [facility] date: 07/16/2021 Discharge date: 07/22/2021  Admitted From: home Discharge disposition: home   Recommendations for Outpatient Follow-Up:   New HD start- t/th/sat Adjust BP meds as needed Patient to keep track of blood sugars and bring log to PCP Vascular follow up for permanent    Discharge Diagnosis:   Principal Problem:   AKI (acute kidney injury) ([place]) Active Problems:   Uncontrolled type 2 diabetes mellitus with hypoglycemia, without long-term current use of insulin ([place])   Essential hypertension   Anemia   Obesity, Class III, BMI 40-49.9 (morbid obesity) ([place])   Cardiomegaly   Hyperkalemia    Discharge Condition: Improved.  Diet recommendation: renal/carb mod  Wound care: None.  Code status: Full.   History of Present Illness:   Marcus [name] is a 66 y.o. male with history of hypertension >40 years, diabetes, hyperlipidemia, brought in by [facility] from [place] for elevated serum creatinine level and significant weight gain since 04/2021, seen as a consultation for the evaluation and management of AKI on CKD. Per patient he was never seen by nephrologist in the past and was mainly managed by his medical doctor in [place].  His serum creatinine level was elevated to 7.3 in 10/2020 and was continue to be elevated to around 13.8 on 05/09/2021.  Reportedly, at that time patient was advised to go to ER for the initiation of dialysis however he was reluctant to do so.  He started having symptoms of generalized weakness, fatigue and worsening lower extremity edema.  He went for general checkup and blood work when the creatinine level noted to be around 14 therefore he was sent to [facility] ER for further management. Patient said his blood pressure was usually elevated and used to  have uncontrolled diabetes.  At home he is on carvedilol, glipizide, losartan, hydrochlorothiazide and potassium chloride. In the ER, the blood pressure was elevated, in room air.  Labs showed creatinine level >18, BUN 129, potassium 5.5, hemoglobin level 7.  He received IV diuretics without much improvement in urine output.  The chest x-ray with cardiomegaly, mild vascular congestion and pleural effusion consistent with congestive heart failure.  The kidney ultrasound with slightly echogenic cortex and medical renal disease without any obstruction.   Hospital Course by Problem:   Uncontrolled type 2 diabetes mellitus with hypoglycemia, without long-term current use of insulin (HCC)  -SSI -d/c glipize upon d/c   New ESRD associated with volume overload and uremic symptoms:  -due to hypertension and diabetes.  UA with proteinuria probably due to diabetes without sediment.  Kidney ultrasound with chronic finding with no obstruction.  Patient received IV diuretics in the ER without much response.   Consult IR to place tunneled HD catheter Per renal: First HD ordered 2/9 -- will be on a t/th/sat schedule   Essential hypertension -resume home meds and adjust for better control   Anemia S/p 1 unit PRBC per Dr. Roel Cluck   Obesity, Class III, BMI 40-49.9 (morbid obesity) ([place]) Estimated body mass index is 44.12 kg/m as calculated from the following:   Height as of this encounter: 5\' 6"  (1.676 m).   Weight as of this encounter: 124 kg.    Cardiomegaly Patient endorses orthopnea in the setting of fluid overload in the setting of severe chronic kidney disease   Hyperkalemia -lokelma x  1 -resolved with HD    Medical Consultants:   Renal vascular   Discharge Exam:   Vitals:   07/22/21 0318 07/22/21 1358  BP:  (!) 163/87  Pulse: 79   Resp: 12   Temp:    SpO2: 94%    Vitals:   07/22/21 0310 07/22/21 0318 07/22/21 0534 07/22/21 1358  BP: (!) 154/85   (!) 163/87  Pulse:  79     Resp: 16 12    Temp: 98.1 F (36.7 C)     TempSrc: Oral     SpO2:  94%    Weight:   124.3 kg   Height:        General exam: Appears calm and comfortable.     The results of significant diagnostics from this hospitalization (including imaging, microbiology, ancillary and laboratory) are listed below for reference.     Procedures and Diagnostic Studies:   DG Chest 2 View  Result Date: 07/16/2021 CLINICAL DATA:  Fluid overload. 27 pound weight gain. Elevated creatinine. EXAM: CHEST - 2 VIEW COMPARISON:  None. FINDINGS: The heart is enlarged. Mild vascular congestion is present. Small effusions are present. Ill-defined scratched at minimal airspace opacity is present at both lung bases. IMPRESSION: Cardiomegaly with mild vascular congestion and small bilateral pleural effusions compatible with congestive heart failure. Electronically Signed   By: San Morelle M.D.   On: 07/16/2021 13:12   US RENAL  Result Date: 07/16/2021 CLINICAL DATA:  Acute kidney injury EXAM: RENAL / URINARY TRACT ULTRASOUND COMPLETE COMPARISON:  None. FINDINGS: Right Kidney: Renal measurements: 9.3 x 3.7 x 4.4 cm = volume: 78.8 mL. Cortex is slightly echogenic. No mass or hydronephrosis Left Kidney: Renal measurements: 9.3 x 5.5 x 4.7 cm = volume: 126.4 mL. Cortex is slightly echogenic. No mass or hydronephrosis Bladder: Appears normal for degree of bladder distention. Other: None. IMPRESSION: Slightly echogenic cortex consistent with medical renal disease. No hydronephrosis Electronically Signed   By: Donavan Foil M.D.   On: 07/16/2021 23:38   IR Fluoro Guide CV Line Right  Result Date: 07/17/2021 INDICATION: 66 year old with end-stage renal disease. Tunneled catheter needed for hemodialysis. EXAM: FLUOROSCOPIC AND ULTRASOUND GUIDED PLACEMENT OF A TUNNELED DIALYSIS CATHETER Physician: Stephan Minister. Anselm Pancoast, MD MEDICATIONS: Ancef 2 g; The antibiotic was administered within an appropriate time interval prior to skin  puncture. ANESTHESIA/SEDATION: Moderate (conscious) sedation was employed during this procedure. A total of Versed 1.5mg  and fentanyl 25 mcg was administered intravenously at the order of the provider performing the procedure. Total intra-service moderate sedation time: 26 minutes. Patient's level of consciousness and vital signs were monitored continuously by radiology nurse throughout the procedure under the supervision of the provider performing the procedure. FLUOROSCOPY TIME:  Radiation Exposure Index (as provided by the fluoroscopic device): 23.7 mGy Kerma COMPLICATIONS: None immediate. PROCEDURE: The procedure was explained to the patient. The risks and benefits of the procedure were discussed and the patient's questions were addressed. Informed consent was obtained from the patient. The patient was placed supine on the interventional table. Ultrasound confirmed a patent right internal jugular vein. Ultrasound image obtained for documentation. The right neck and chest was prepped and draped in a sterile fashion. Maximal barrier sterile technique was utilized including caps, mask, sterile gowns, sterile gloves, sterile drape, hand hygiene and skin antiseptic. The right neck was anesthetized with 1% lidocaine. A small incision was made with #11 blade scalpel. A 21 gauge needle directed into the right internal jugular vein with ultrasound guidance. A micropuncture dilator  set was placed. A 23 cm tip to cuff Palindrome catheter was selected. The skin below the right clavicle was anesthetized and a small incision was made with an #11 blade scalpel. A subcutaneous tunnel was formed to the vein dermatotomy site. The catheter was brought through the tunnel. The vein dermatotomy site was dilated to accommodate a peel-away sheath. The catheter was placed through the peel-away sheath and directed into the central venous structures. The tip of the catheter was placed in the upper right atrium with fluoroscopy.  Fluoroscopic images were obtained for documentation. Both lumens were found to aspirate and flush well. The proper amount of heparin was flushed in both lumens. The vein dermatotomy site was closed using a single layer of absorbable suture and Dermabond. Gel-Foam was placed in subcutaneous tract. The catheter was secured to the skin using Prolene suture. IMPRESSION: Successful placement of a right jugular tunneled dialysis catheter using ultrasound and fluoroscopic guidance. Electronically Signed   By: Markus Daft M.D.   On: 07/17/2021 15:23   IR US Guide Vasc Access Right  Result Date: 07/17/2021 INDICATION: 66 year old with end-stage renal disease. Tunneled catheter needed for hemodialysis. EXAM: FLUOROSCOPIC AND ULTRASOUND GUIDED PLACEMENT OF A TUNNELED DIALYSIS CATHETER Physician: Stephan Minister. Anselm Pancoast, MD MEDICATIONS: Ancef 2 g; The antibiotic was administered within an appropriate time interval prior to skin puncture. ANESTHESIA/SEDATION: Moderate (conscious) sedation was employed during this procedure. A total of Versed 1.5mg  and fentanyl 25 mcg was administered intravenously at the order of the provider performing the procedure. Total intra-service moderate sedation time: 26 minutes. Patient's level of consciousness and vital signs were monitored continuously by radiology nurse throughout the procedure under the supervision of the provider performing the procedure. FLUOROSCOPY TIME:  Radiation Exposure Index (as provided by the fluoroscopic device): 03.4 mGy Kerma COMPLICATIONS: None immediate. PROCEDURE: The procedure was explained to the patient. The risks and benefits of the procedure were discussed and the patient's questions were addressed. Informed consent was obtained from the patient. The patient was placed supine on the interventional table. Ultrasound confirmed a patent right internal jugular vein. Ultrasound image obtained for documentation. The right neck and chest was prepped and draped in a sterile  fashion. Maximal barrier sterile technique was utilized including caps, mask, sterile gowns, sterile gloves, sterile drape, hand hygiene and skin antiseptic. The right neck was anesthetized with 1% lidocaine. A small incision was made with #11 blade scalpel. A 21 gauge needle directed into the right internal jugular vein with ultrasound guidance. A micropuncture dilator set was placed. A 23 cm tip to cuff Palindrome catheter was selected. The skin below the right clavicle was anesthetized and a small incision was made with an #11 blade scalpel. A subcutaneous tunnel was formed to the vein dermatotomy site. The catheter was brought through the tunnel. The vein dermatotomy site was dilated to accommodate a peel-away sheath. The catheter was placed through the peel-away sheath and directed into the central venous structures. The tip of the catheter was placed in the upper right atrium with fluoroscopy. Fluoroscopic images were obtained for documentation. Both lumens were found to aspirate and flush well. The proper amount of heparin was flushed in both lumens. The vein dermatotomy site was closed using a single layer of absorbable suture and Dermabond. Gel-Foam was placed in subcutaneous tract. The catheter was secured to the skin using Prolene suture. IMPRESSION: Successful placement of a right jugular tunneled dialysis catheter using ultrasound and fluoroscopic guidance. Electronically Signed   By: Scherrie Gerlach.D.  On: 07/17/2021 15:23   ECHOCARDIOGRAM COMPLETE  Result Date: 07/17/2021    ECHOCARDIOGRAM REPORT   Patient Name:   Marcus [name] Date of Exam: 07/17/2021 Medical Rec #:  [id number]                    Height:       67.0 in Accession #:    [id number]                   Weight:       274.0 lb Date of Birth:  04-29-1956                    BSA:          2.312 m Patient Age:    8 years                     BP:           195/91 mmHg Patient Gender: M                            HR:           76 bpm.  Exam Location:  Inpatient Procedure: 2D Echo Indications:    Cardiomegaly  History:        Patient has no prior history of Echocardiogram examinations.                 Risk Factors:Diabetes and Hypertension.  Sonographer:    Jefferey Pica Referring Phys: [place]  Sonographer Comments: Study is incomplete. Patient refused to proceed with rest of test due to lack of food and number of recent procedures performed. IMPRESSIONS  1. Left ventricular ejection fraction, by estimation, is 60 to 65%. The left ventricle has normal function. There is moderate concentric left ventricular hypertrophy.  2. The mitral valve is grossly normal. No evidence of mitral valve regurgitation.  3. The aortic valve is grossly normal. Aortic valve regurgitation is not visualized. Comparison(s): No prior Echocardiogram. Conclusion(s)/Recommendation(s): Limited echo. FINDINGS  Left Ventricle: Left ventricular ejection fraction, by estimation, is 60 to 65%. The left ventricle has normal function. The left ventricular internal cavity size was normal in size. There is moderate concentric left ventricular hypertrophy. Mitral Valve: The mitral valve is grossly normal. No evidence of mitral valve regurgitation. Aortic Valve: The aortic valve is grossly normal. Aortic valve regurgitation is not visualized. Pulmonic Valve: Pulmonic valve regurgitation is not visualized.  LEFT VENTRICLE PLAX 2D LVIDd:         5.00 cm LVIDs:         3.40 cm LV PW:         1.50 cm LV IVS:        1.50 cm LVOT diam:     2.00 cm LVOT Area:     3.14 cm  LEFT ATRIUM         Index LA diam:    4.10 cm 1.77 cm/m                        PULMONIC VALVE AORTA                 PV Vmax:       0.90 m/s Ao Root diam: 3.60 cm PV Peak grad:  3.2 mmHg Ao Asc diam:  3.40 cm   SHUNTS Systemic Diam: 2.00 cm [facility]  [facility] Electronically signed by Phineas Inches Signature Date/Time: 07/17/2021/5:54:43 PM    Final      Labs:   Basic Metabolic Panel: Recent Labs  Lab  07/16/21 2347 07/16/21 2351 07/17/21 0319 07/17/21 0408 07/17/21 2130 07/18/21 8657 07/19/21 0118 07/20/21 1132  NA 142   < > 143 143 142 142 141 136  K 4.9   < > 4.7 4.7 4.6 5.1 4.3 3.5  CL 114*   < > 110  --  111 111 108 103  CO2 16*  --  16*  --  17* 16* 21* 23  GLUCOSE 147*   < > 141*  --  98 194* 154* 275*  BUN 112*   < > 111*  --  110* 115* 92* 63*  CREATININE 16.04*   < > 16.10*  --  16.12* 16.22* 13.44* 11.22*  CALCIUM 7.1*  --  7.2*  --  7.2* 7.0* 7.2* 7.8*  MG 1.6*  --  1.6*  --   --   --   --   --   PHOS 7.5*  --  7.8*  --  8.2* 8.9* 6.3* 4.0   < > = values in this interval not displayed.   GFR Estimated Creatinine Clearance: 8.4 mL/min (A) (by C-G formula based on SCr of 11.22 mg/dL (H)). Liver Function Tests: Recent Labs  Lab 07/16/21 1303 07/16/21 2347 07/17/21 0319 07/17/21 0728 07/18/21 0834 07/19/21 0118 07/20/21 1132  AST 14* 10* 12*  --   --   --   --   ALT 14 15 15   --   --   --   --   ALKPHOS 48 43 43  --   --   --   --   BILITOT 0.3 0.3 0.2*  --   --   --   --   PROT 6.4* 6.0* 6.1*  --   --   --   --   ALBUMIN 3.0* 2.8* 2.8* 2.8* 2.9* 2.7* 3.0*   No results for input(s): LIPASE, AMYLASE in the last 168 hours. No results for input(s): AMMONIA in the last 168 hours. Coagulation profile No results for input(s): INR, PROTIME in the last 168 hours.  CBC: Recent Labs  Lab 07/16/21 1303 07/16/21 1314 07/16/21 2347 07/16/21 2351 07/17/21 0319 07/17/21 0408 07/17/21 0728 07/18/21 0834 07/19/21 0118 07/20/21 1132  WBC 6.3  --  6.1  --  5.8  --  6.0 7.7 6.4 6.8  NEUTROABS 4.2  --  4.3  --  3.9  --   --   --   --   --   HGB 7.9*   < > 7.0*   < > 7.1* 7.1* 8.1* 8.5* 8.1* 8.6*  HCT 24.4*   < > 22.3*   < > 22.7* 21.0* 24.6* 26.6* 24.6* 26.3*  MCV 97.2  --  98.7  --  100.0  --  93.9 95.7 93.2 94.3  PLT 188  --  181  --  183  --  190 197 179 169   < > = values in this interval not displayed.   Cardiac Enzymes: Recent Labs  Lab 07/16/21 2347   CKTOTAL 669*   BNP: Invalid input(s): POCBNP CBG: Recent Labs  Lab 07/21/21 1113 07/21/21 1647 07/21/21 2127 07/22/21 0635 07/22/21 1134  GLUCAP 170* 221* 236* 155* 180*   D-Dimer No results for input(s): DDIMER in the last 72 hours. Hgb A1c No results for input(s): HGBA1C in the last 72 hours. Lipid Profile No  results for input(s): CHOL, HDL, LDLCALC, TRIG, CHOLHDL, LDLDIRECT in the last 72 hours. Thyroid function studies No results for input(s): TSH, T4TOTAL, T3FREE, THYROIDAB in the last 72 hours.  Invalid input(s): FREET3 Anemia work up No results for input(s): VITAMINB12, FOLATE, FERRITIN, TIBC, IRON, RETICCTPCT in the last 72 hours. Microbiology Recent Results (from the past 240 hour(s))  Resp Panel by RT-PCR (Flu A&B, Covid) Nasopharyngeal Swab     Status: None   Collection Time: 07/16/21  9:41 PM   Specimen: Nasopharyngeal Swab; Nasopharyngeal(NP) swabs in vial transport medium  Result Value Ref Range Status   SARS Coronavirus 2 by RT PCR NEGATIVE NEGATIVE Final    Comment: (NOTE) SARS-CoV-2 target nucleic acids are NOT DETECTED.  The SARS-CoV-2 RNA is generally detectable in upper respiratory specimens during the acute phase of infection. The lowest concentration of SARS-CoV-2 viral copies this assay can detect is 138 copies/mL. A negative result does not preclude SARS-Cov-2 infection and should not be used as the sole basis for treatment or other patient management decisions. A negative result may occur with  improper specimen collection/handling, submission of specimen other than nasopharyngeal swab, presence of viral mutation(s) within the areas targeted by this assay, and inadequate number of viral copies(<138 copies/mL). A negative result must be combined with clinical observations, patient history, and epidemiological information. The expected result is Negative.  Fact Sheet for Patients:  EntrepreneurPulse.com.au  Fact Sheet for  Healthcare Providers:  IncredibleEmployment.be  This test is no t yet approved or cleared by the [place] FDA and  has been authorized for detection and/or diagnosis of SARS-CoV-2 by FDA under an Emergency Use Authorization (EUA). This EUA will remain  in effect (meaning this test can be used) for the duration of the COVID-19 declaration under Section 564(b)(1) of the Act, 21 U.S.C.section 360bbb-3(b)(1), unless the authorization is terminated  or revoked sooner.       Influenza A by PCR NEGATIVE NEGATIVE Final   Influenza B by PCR NEGATIVE NEGATIVE Final    Comment: (NOTE) The Xpert Xpress SARS-CoV-2/FLU/RSV plus assay is intended as an aid in the diagnosis of influenza from Nasopharyngeal swab specimens and should not be used as a sole basis for treatment. Nasal washings and aspirates are unacceptable for Xpert Xpress SARS-CoV-2/FLU/RSV testing.  Fact Sheet for Patients: EntrepreneurPulse.com.au  Fact Sheet for Healthcare Providers: IncredibleEmployment.be  This test is not yet approved or cleared by the [place] FDA and has been authorized for detection and/or diagnosis of SARS-CoV-2 by FDA under an Emergency Use Authorization (EUA). This EUA will remain in effect (meaning this test can be used) for the duration of the COVID-19 declaration under Section 564(b)(1) of the Act, 21 U.S.C. section 360bbb-3(b)(1), unless the authorization is terminated or revoked.  Performed at [facility], [place] [address], [place], [place] [address]   MRSA Next Gen by PCR, Nasal     Status: None   Collection Time: 07/17/21 10:23 PM   Specimen: Nasal Mucosa; Nasal Swab  Result Value Ref Range Status   MRSA by PCR Next Gen NOT DETECTED NOT DETECTED Final    Comment: (NOTE) The GeneXpert MRSA Assay (FDA approved for NASAL specimens only), is one component of a comprehensive MRSA colonization surveillance program. It is not  intended to diagnose MRSA infection nor to guide or monitor treatment for MRSA infections. Test performance is not FDA approved in patients less than 61 years old. Performed at [facility], [place] [address], [place], [place] [address]  Discharge Instructions:   Discharge Instructions     Diet - low sodium heart healthy   Complete by: As directed    Diet Carb Modified   Complete by: As directed    Discharge instructions   Complete by: As directed    Keep log of your blood sugars (fasting and 1-2hours after meals)- bring to PCP for consideration of insulin Continue HD   Increase activity slowly   Complete by: As directed    No wound care   Complete by: As directed       Allergies as of 07/22/2021       Reactions   Amlodipine Swelling, Other (See Comments)   Swelling of lower limbs        Medication List     STOP taking these medications    doxycycline 100 MG capsule Commonly known as: VIBRAMYCIN   glipiZIDE 5 MG tablet Commonly known as: GLUCOTROL   hydrochlorothiazide 12.5 MG tablet Commonly known as: HYDRODIURIL   potassium chloride 10 MEQ tablet Commonly known as: KLOR-CON       TAKE these medications    acetaminophen 325 MG tablet Commonly known as: TYLENOL Take 650 mg by mouth every 6 (six) hours as needed for moderate pain or headache.   albuterol 108 (90 Base) MCG/ACT inhaler Commonly known as: VENTOLIN HFA Inhale 1-2 puffs into the lungs every 6 (six) hours as needed for wheezing or shortness of breath.   atorvastatin 20 MG tablet Commonly known as: LIPITOR Take 10 mg by mouth at bedtime.   B Complex-B12 Tabs Take 2 tablets by mouth daily.   budesonide-formoterol 160-4.5 MCG/ACT inhaler Commonly known as: Symbicort Inhale 2 puffs into the lungs in the morning and at bedtime.   carvedilol 25 MG tablet Commonly known as: COREG Take 25 mg by mouth 2 (two) times daily with a meal.   feeding supplement (NEPRO CARB STEADY)  Liqd Take 237 mLs by mouth daily.   hydrALAZINE 25 MG tablet Commonly known as: APRESOLINE Take 1 tablet (25 mg total) by mouth every 8 (eight) hours.   HYDROcodone-acetaminophen 5-325 MG tablet Commonly known as: NORCO/VICODIN Take 1-2 tablets by mouth every 4 (four) hours as needed for up to 3 days for moderate pain.   losartan 100 MG tablet Commonly known as: COZAAR Take 100 mg by mouth at bedtime.   multivitamin Tabs tablet Take 1 tablet by mouth at bedtime.   sevelamer carbonate 800 MG tablet Commonly known as: RENVELA Take 1 tablet (800 mg total) by mouth 3 (three) times daily with meals.               Durable Medical Equipment  (From admission, onward)           Start     Ordered   07/22/21 1426  For home use only DME 3 n 1  Once        07/22/21 1426   07/22/21 1426  For home use only DME 4 wheeled rolling walker with seat  Once       Question:  Patient needs a walker to treat with the following condition  Answer:  Weakness   07/22/21 1426            Follow-up [facility] Follow up.   Why: Your home health has been set up with [facility]. The office will call you with start of service information. If you have any questions please call the number listed  above. Contact information: [address]  [place] [phone]        [place], [facility] Follow up.   Why: Schedule is Tuesday/Thursday/Saturday with 11:25 chair time.  Patient can start on 2/14.  Patient needs to arrive at 10:30 for first appointment. Contact information: [place] [address] [phone]                  Time coordinating discharge: 35 min  Signed:  Geradine Girt DO  [facility] 07/22/2021, 4:46 PM

## 2021-07-23 NOTE — TOC Progression Note (Addendum)
Transition of Care [facility]) - Progression Note    Patient Details  Name: Marcus [name] MRN: [id number] Date of Birth: [birth year]  Transition of Care [facility]) CM/SW [place], RN Phone Number:[phone]  07/23/2021, 11:23 AM  Clinical Narrative:    CM just received return call from [place] for [place]. Per Delana Meyer she will email CM the paperwork that must be filled out in order for the patient to receive DME. Jasmine states DME will be delivered in 3-5 business days. Jasmine also confirms that rollator can not be dispensed on an emergency basis and the veteran will need to come in to receive.   1400 Urgent items for discharge form has need faxed to [facility] @ [phone].     Barriers to Discharge: Continued Medical Work up  Expected Discharge Plan and Services           Expected Discharge Date: 07/22/21               DME Arranged: Gilford Rile rolling with seat, 3-N-1 ([facility] with [facility]) DME Agency:  ([place]) Date DME Agency Contacted: 07/22/21 Time DME Agency Contacted: [id number]   [place]: [place] Date HH Agency Contacted: 07/22/21 Time [facility]: [id number] Representative spoke with at [place]: [place] ([place]) Interventions    Readmission Risk Interventions Readmission Risk Prevention Plan 07/22/2021  Transportation Screening Complete  PCP or Specialist Appt within 5-7 Days Complete  Home Care Screening Complete  Medication Review (RN CM) Referral to Pharmacy

## 2021-07-26 ENCOUNTER — Other Ambulatory Visit: Payer: Self-pay

## 2021-09-03 ENCOUNTER — Other Ambulatory Visit: Payer: Self-pay

## 2021-09-03 ENCOUNTER — Encounter (HOSPITAL_COMMUNITY): Payer: Self-pay | Admitting: Vascular Surgery

## 2021-09-03 MED ORDER — HEPARIN SODIUM (PORCINE) 1000 UNIT/ML DIALYSIS
20.0000 [IU]/kg | INTRAMUSCULAR | Status: DC | PRN
  Filled 2021-09-03: qty 3

## 2021-09-03 MED ORDER — LIDOCAINE HCL (PF) 1 % IJ SOLN
5.0000 mL | INTRAMUSCULAR | Status: DC | PRN
  Filled 2021-09-03: qty 5

## 2021-09-03 MED ORDER — ALTEPLASE 2 MG IJ SOLR
2.0000 mg | Freq: Once | INTRAMUSCULAR | Status: DC | PRN
  Filled 2021-09-03: qty 2

## 2021-09-03 MED ORDER — HEPARIN SODIUM (PORCINE) 1000 UNIT/ML DIALYSIS
1000.0000 [IU] | INTRAMUSCULAR | Status: DC | PRN
  Filled 2021-09-03: qty 1

## 2021-09-03 MED ORDER — SODIUM CHLORIDE 0.9 % IV SOLN: 100.0000 mL | INTRAVENOUS | Status: DC | PRN

## 2021-09-03 MED ORDER — PENTAFLUOROPROP-TETRAFLUOROETH EX AERO
1.0000 "application " | INHALATION_SPRAY | CUTANEOUS | Status: DC | PRN
  Filled 2021-09-03: qty 116

## 2021-09-03 MED ORDER — LIDOCAINE-PRILOCAINE 2.5-2.5 % EX CREA
1.0000 "application " | TOPICAL_CREAM | CUTANEOUS | Status: DC | PRN
  Filled 2021-09-03: qty 5

## 2021-09-03 NOTE — Progress Notes (Signed)
Mr. Wert  denies chest pain or shortness of breath. Mr. denies having any s/s of Covid in her household.  Patient denies any known exposure to Covid.  ? ?Mr. Manges has a history of type II diabetes, last A1C was 5.0- drawn 07/16/21. Mr. Champeau does not take medication for DM's.  I instructed patient to check CBG after awaking and every 2 hours until arrival  to the hospital. I Instructed patient if CBG is less than 70 to take 4 Glucose Tablets or 1 tube of Glucose Gel or 1/2 cup of a clear juice. Recheck CBG in 15 minutes if CBG is not over 70 call, pre- op desk at [phone] for further instructions.  ? ?I instructed Mr. Dettore to shower with antibacteria soap.  No nail polish, artificial or acrylic nails. Wear clean clothes, brush your teeth. ?Glasses, contact lens,dentures or partials may not be worn in the OR. If you need to wear them, please bring a case for glasses, do not wear contacts or bring a case, the hospital does not have contact cases, dentures or partials will have to be removed , make sure they are clean, we will provide a denture cup to put them in. You will need some one to drive you home and a responsible person over the age of 39 to stay with you for the first 24 hours after surgery.  ?

## 2021-09-04 NOTE — Anesthesia Preprocedure Evaluation (Addendum)
Anesthesia Evaluation  ?Patient identified by MRN, date of birth, ID band ?Patient awake ? ? ? ?Reviewed: ?Allergy & Precautions, H&P , NPO status , Patient's Chart, lab work & pertinent test results, reviewed documented beta blocker date and time  ? ?Airway ?Mallampati: II ? ?TM Distance: >3 FB ?Neck ROM: Full ? ? ? Dental ?no notable dental hx. ?(+) Edentulous Upper, Dental Advisory Given ?  ?Pulmonary ?neg pulmonary ROS,  ?  ?Pulmonary exam normal ?breath sounds clear to auscultation ? ? ? ? ? ? Cardiovascular ?Exercise Tolerance: Good ?hypertension, Pt. on medications and Pt. on home beta blockers ? ?Rhythm:Regular Rate:Normal ? ? ?  ?Neuro/Psych ?negative neurological ROS ? negative psych ROS  ? GI/Hepatic ?negative GI ROS, Neg liver ROS,   ?Endo/Other  ?diabetesMorbid obesity ? Renal/GU ?ESRF and DialysisRenal disease  ?negative genitourinary ?  ?Musculoskeletal ? ?(+) Arthritis , Osteoarthritis,   ? Abdominal ?  ?Peds ? Hematology ? ?(+) Blood dyscrasia, anemia ,   ?Anesthesia Other Findings ? ? Reproductive/Obstetrics ?negative OB ROS ? ?  ? ? ? ? ? ? ? ? ? ? ? ? ? ?  ?  ? ? ? ? ? ? ? ?Anesthesia Physical ?Anesthesia Plan ? ?ASA: 3 ? ?Anesthesia Plan: MAC and Regional  ? ?Post-op Pain Management: Tylenol PO (pre-op)*  ? ?Induction:  ? ?PONV Risk Score and Plan: 1 and Propofol infusion ? ?Airway Management Planned: Natural Airway and Simple Face Mask ? ?Additional Equipment:  ? ?Intra-op Plan:  ? ?Post-operative Plan:  ? ?Informed Consent: I have reviewed the patients History and Physical, chart, labs and discussed the procedure including the risks, benefits and alternatives for the proposed anesthesia with the patient or authorized representative who has indicated his/her understanding and acceptance.  ? ? ? ?Dental advisory given ? ?Plan Discussed with: CRNA ? ?Anesthesia Plan Comments:   ? ? ? ? ? ?Anesthesia Quick Evaluation ? ?

## 2021-09-05 ENCOUNTER — Ambulatory Visit (HOSPITAL_BASED_OUTPATIENT_CLINIC_OR_DEPARTMENT_OTHER): Payer: No Typology Code available for payment source | Admitting: Anesthesiology

## 2021-09-05 ENCOUNTER — Other Ambulatory Visit: Payer: Self-pay

## 2021-09-05 ENCOUNTER — Ambulatory Visit (HOSPITAL_COMMUNITY): Payer: No Typology Code available for payment source | Admitting: Anesthesiology

## 2021-09-05 ENCOUNTER — Encounter (HOSPITAL_COMMUNITY)
Admission: RE | Disposition: A | Discharge status: Home / Self Care | Payer: Self-pay | Source: Home / Self Care | Attending: Vascular Surgery

## 2021-09-05 ENCOUNTER — Encounter (HOSPITAL_COMMUNITY): Payer: Self-pay | Admitting: Vascular Surgery

## 2021-09-05 ENCOUNTER — Ambulatory Visit (HOSPITAL_COMMUNITY)
Admission: RE | Admit: 2021-09-05 | Discharge: 2021-09-05 | Disposition: A | Discharge status: Home / Self Care | Payer: No Typology Code available for payment source | Attending: Vascular Surgery | Admitting: Vascular Surgery

## 2021-09-05 DIAGNOSIS — Z79899 Other long term (current) drug therapy: Secondary | ICD-10-CM | POA: Diagnosis not present

## 2021-09-05 DIAGNOSIS — N186 End stage renal disease: Secondary | ICD-10-CM

## 2021-09-05 DIAGNOSIS — I120 Hypertensive chronic kidney disease with stage 5 chronic kidney disease or end stage renal disease: Secondary | ICD-10-CM | POA: Diagnosis present

## 2021-09-05 DIAGNOSIS — Z6841 Body Mass Index (BMI) 40.0 and over, adult: Secondary | ICD-10-CM | POA: Diagnosis not present

## 2021-09-05 DIAGNOSIS — E1122 Type 2 diabetes mellitus with diabetic chronic kidney disease: Secondary | ICD-10-CM

## 2021-09-05 DIAGNOSIS — Z992 Dependence on renal dialysis: Secondary | ICD-10-CM | POA: Insufficient documentation

## 2021-09-05 DIAGNOSIS — N185 Chronic kidney disease, stage 5: Secondary | ICD-10-CM | POA: Diagnosis not present

## 2021-09-05 DIAGNOSIS — N189 Chronic kidney disease, unspecified: Secondary | ICD-10-CM

## 2021-09-05 DIAGNOSIS — D631 Anemia in chronic kidney disease: Secondary | ICD-10-CM | POA: Diagnosis not present

## 2021-09-05 HISTORY — PX: AV FISTULA PLACEMENT: SHX1204

## 2021-09-05 HISTORY — DX: Anemia, unspecified: D64.9

## 2021-09-05 HISTORY — DX: End stage renal disease: N18.6

## 2021-09-05 HISTORY — DX: Other complications of anesthesia, initial encounter: T88.59XA

## 2021-09-05 HISTORY — DX: Chronic kidney disease, unspecified: N18.9

## 2021-09-05 HISTORY — DX: Unspecified osteoarthritis, unspecified site: M19.90

## 2021-09-05 LAB — POCT I-STAT, CHEM 8
BUN: 26 mg/dL — ABNORMAL HIGH (ref 8–23)
Calcium, Ion: 1.21 mmol/L (ref 1.15–1.40)
Chloride: 100 mmol/L (ref 98–111)
Creatinine, Ser: 7.1 mg/dL — ABNORMAL HIGH (ref 0.61–1.24)
Glucose, Bld: 179 mg/dL — ABNORMAL HIGH (ref 70–99)
HCT: 32 % — ABNORMAL LOW (ref 39.0–52.0)
Hemoglobin: 10.9 g/dL — ABNORMAL LOW (ref 13.0–17.0)
Potassium: 3.9 mmol/L (ref 3.5–5.1)
Sodium: 140 mmol/L (ref 135–145)
TCO2: 26 mmol/L (ref 22–32)

## 2021-09-05 LAB — GLUCOSE, CAPILLARY
Glucose-Capillary: 179 mg/dL — ABNORMAL HIGH (ref 70–99)
Glucose-Capillary: 152 mg/dL — ABNORMAL HIGH (ref 70–99)

## 2021-09-05 SURGERY — ARTERIOVENOUS (AV) FISTULA CREATION
Anesthesia: Monitor Anesthesia Care | Site: Arm Upper | Laterality: Left

## 2021-09-05 MED ORDER — CEFAZOLIN SODIUM-DEXTROSE 2-4 GM/100ML-% IV SOLN
2.0000 g | INTRAVENOUS | Status: AC
  Administered 2021-09-05: 2 g via INTRAVENOUS

## 2021-09-05 MED ORDER — CHLORHEXIDINE GLUCONATE 4 % EX LIQD: 60.0000 mL | Freq: Once | CUTANEOUS | Status: DC

## 2021-09-05 MED ORDER — PHENYLEPHRINE 40 MCG/ML (10ML) SYRINGE FOR IV PUSH (FOR BLOOD PRESSURE SUPPORT)
PREFILLED_SYRINGE | INTRAVENOUS | Status: DC | PRN
  Administered 2021-09-05: 80 ug via INTRAVENOUS
  Administered 2021-09-05: 40 ug via INTRAVENOUS
  Administered 2021-09-05: 120 ug via INTRAVENOUS
  Administered 2021-09-05 (×2): 80 ug via INTRAVENOUS

## 2021-09-05 MED ORDER — CHLORHEXIDINE GLUCONATE 0.12 % MT SOLN
OROMUCOSAL | Status: AC
  Administered 2021-09-05: 15 mL
  Filled 2021-09-05: qty 15

## 2021-09-05 MED ORDER — ACETAMINOPHEN 500 MG PO TABS: 1000.0000 mg | ORAL_TABLET | Freq: Once | ORAL | Status: AC

## 2021-09-05 MED ORDER — PHENYLEPHRINE 40 MCG/ML (10ML) SYRINGE FOR IV PUSH (FOR BLOOD PRESSURE SUPPORT)
PREFILLED_SYRINGE | INTRAVENOUS | Status: AC
  Filled 2021-09-05: qty 10

## 2021-09-05 MED ORDER — INSULIN ASPART 100 UNIT/ML IJ SOLN: 0.0000 [IU] | INTRAMUSCULAR | Status: DC | PRN

## 2021-09-05 MED ORDER — FENTANYL CITRATE (PF) 100 MCG/2ML IJ SOLN
INTRAMUSCULAR | Status: DC | PRN
  Administered 2021-09-05: 50 ug via INTRAVENOUS

## 2021-09-05 MED ORDER — PROPOFOL 500 MG/50ML IV EMUL
INTRAVENOUS | Status: DC | PRN
  Administered 2021-09-05: 75 ug/kg/min via INTRAVENOUS

## 2021-09-05 MED ORDER — BUPIVACAINE HCL (PF) 0.5 % IJ SOLN
INTRAMUSCULAR | Status: DC | PRN
  Administered 2021-09-05: 30 mL via PERINEURAL

## 2021-09-05 MED ORDER — HEPARIN 6000 UNIT IRRIGATION SOLUTION
Status: DC | PRN
  Administered 2021-09-05: 1

## 2021-09-05 MED ORDER — HEPARIN SODIUM (PORCINE) 1000 UNIT/ML IJ SOLN
INTRAMUSCULAR | Status: DC | PRN
  Administered 2021-09-05: 5000 [IU] via INTRAVENOUS

## 2021-09-05 MED ORDER — MIDAZOLAM HCL 5 MG/5ML IJ SOLN
INTRAMUSCULAR | Status: DC | PRN
  Administered 2021-09-05: 2 mg via INTRAVENOUS

## 2021-09-05 MED ORDER — CEFAZOLIN SODIUM-DEXTROSE 2-4 GM/100ML-% IV SOLN
INTRAVENOUS | Status: AC
  Filled 2021-09-05: qty 100

## 2021-09-05 MED ORDER — SODIUM CHLORIDE 0.9 % IV SOLN: INTRAVENOUS | Status: DC

## 2021-09-05 MED ORDER — PROPOFOL 10 MG/ML IV BOLUS
INTRAVENOUS | Status: AC
  Filled 2021-09-05: qty 20

## 2021-09-05 MED ORDER — 0.9 % SODIUM CHLORIDE (POUR BTL) OPTIME
TOPICAL | Status: DC | PRN
  Administered 2021-09-05: 1000 mL

## 2021-09-05 MED ORDER — MIDAZOLAM HCL 2 MG/2ML IJ SOLN
INTRAMUSCULAR | Status: AC
  Filled 2021-09-05: qty 2

## 2021-09-05 MED ORDER — HEPARIN 6000 UNIT IRRIGATION SOLUTION
Status: AC
  Filled 2021-09-05: qty 500

## 2021-09-05 MED ORDER — ACETAMINOPHEN 500 MG PO TABS
ORAL_TABLET | ORAL | Status: AC
  Administered 2021-09-05: 1000 mg via ORAL
  Filled 2021-09-05: qty 2

## 2021-09-05 MED ORDER — FENTANYL CITRATE (PF) 100 MCG/2ML IJ SOLN: 25.0000 ug | INTRAMUSCULAR | Status: DC | PRN

## 2021-09-05 MED ORDER — FENTANYL CITRATE (PF) 100 MCG/2ML IJ SOLN
INTRAMUSCULAR | Status: AC
  Filled 2021-09-05: qty 2

## 2021-09-05 MED ORDER — HYDROCODONE-ACETAMINOPHEN 5-325 MG PO TABS: 1.0000 | ORAL_TABLET | Freq: Four times a day (QID) | ORAL | 0 refills | Status: AC | PRN

## 2021-09-05 SURGICAL SUPPLY — 38 items
ARMBAND PINK RESTRICT EXTREMIT (MISCELLANEOUS) ×2 IMPLANT
BENZOIN TINCTURE PRP APPL 2/3 (GAUZE/BANDAGES/DRESSINGS) ×1 IMPLANT
CANISTER SUCT 3000ML PPV (MISCELLANEOUS) ×2 IMPLANT
CANNULA VESSEL 3MM 2 BLNT TIP (CANNULA) ×3 IMPLANT
CHLORAPREP W/TINT 26 (MISCELLANEOUS) ×2 IMPLANT
CLIP LIGATING EXTRA MED SLVR (CLIP) ×2 IMPLANT
CLIP LIGATING EXTRA SM BLUE (MISCELLANEOUS) ×2 IMPLANT
COVER PROBE W GEL 5X96 (DRAPES) ×1 IMPLANT
DERMABOND ADVANCED (GAUZE/BANDAGES/DRESSINGS) ×1
DERMABOND ADVANCED .7 DNX12 (GAUZE/BANDAGES/DRESSINGS) IMPLANT
ELECT REM PT RETURN 9FT ADLT (ELECTROSURGICAL) ×2
ELECTRODE REM PT RTRN 9FT ADLT (ELECTROSURGICAL) ×1 IMPLANT
GAUZE 4X4 16PLY ~~LOC~~+RFID DBL (SPONGE) ×1 IMPLANT
GLOVE SURG POLYISO LF SZ8 (GLOVE) ×2 IMPLANT
GOWN STRL REUS W/ TWL LRG LVL3 (GOWN DISPOSABLE) ×2 IMPLANT
GOWN STRL REUS W/ TWL XL LVL3 (GOWN DISPOSABLE) ×1 IMPLANT
GOWN STRL REUS W/TWL LRG LVL3 (GOWN DISPOSABLE) ×2
GOWN STRL REUS W/TWL XL LVL3 (GOWN DISPOSABLE) ×2
INSERT FOGARTY SM (MISCELLANEOUS) IMPLANT
KIT BASIN OR (CUSTOM PROCEDURE TRAY) ×2 IMPLANT
KIT TURNOVER KIT B (KITS) ×2 IMPLANT
NDL 18GX1X1/2 (RX/OR ONLY) (NEEDLE) IMPLANT
NEEDLE 18GX1X1/2 (RX/OR ONLY) (NEEDLE) IMPLANT
NS IRRIG 1000ML POUR BTL (IV SOLUTION) ×2 IMPLANT
PACK CV ACCESS (CUSTOM PROCEDURE TRAY) ×2 IMPLANT
PAD ARMBOARD 7.5X6 YLW CONV (MISCELLANEOUS) ×4 IMPLANT
SLING ARM FOAM STRAP LRG (SOFTGOODS) IMPLANT
SLING ARM FOAM STRAP MED (SOFTGOODS) IMPLANT
SPONGE T-LAP 18X18 ~~LOC~~+RFID (SPONGE) ×1 IMPLANT
STRIP CLOSURE SKIN 1/2X4 (GAUZE/BANDAGES/DRESSINGS) ×1 IMPLANT
SUT MNCRL AB 4-0 PS2 18 (SUTURE) ×2 IMPLANT
SUT PROLENE 6 0 BV (SUTURE) ×3 IMPLANT
SUT VIC AB 3-0 SH 27 (SUTURE) ×1
SUT VIC AB 3-0 SH 27X BRD (SUTURE) ×1 IMPLANT
SYR 3ML LL SCALE MARK (SYRINGE) IMPLANT
TOWEL GREEN STERILE (TOWEL DISPOSABLE) ×2 IMPLANT
UNDERPAD 30X36 HEAVY ABSORB (UNDERPADS AND DIAPERS) ×2 IMPLANT
WATER STERILE IRR 1000ML POUR (IV SOLUTION) ×2 IMPLANT

## 2021-09-05 NOTE — Anesthesia Postprocedure Evaluation (Signed)
Anesthesia Post Note ? ?Patient: Marcus [name] ? ?Procedure(s) Performed: LEFT ARM  RADIOCEPHALIC ARTERIOVENOUS (AV) FISTULA CREATION (Left: Arm Upper) ? ?  ? ?Patient location during evaluation: PACU ?Anesthesia Type: Regional and MAC ?Level of consciousness: awake and alert ?Pain management: pain level controlled ?Vital Signs Assessment: post-procedure vital signs reviewed and stable ?Respiratory status: spontaneous breathing, nonlabored ventilation and respiratory function stable ?Cardiovascular status: stable and blood pressure returned to baseline ?Postop Assessment: no apparent nausea or vomiting ?Anesthetic complications: no ? ? ?No notable events documented. ? ?Last Vitals:  ?Vitals:  ? 09/05/21 0915 09/05/21 0930  ?BP: (!) 165/95 (!) 179/95  ?Pulse: 73 70  ?Resp: (!) 4 11  ?Temp:  (!) 36.2 ?C  ?SpO2: 97% 97%  ?  ?Last Pain:  ?Vitals:  ? 09/05/21 0639  ?TempSrc:   ?PainSc: 0-No pain  ? ? ?  ?  ?  ?  ?  ?  ? ?Patriciaann Rabanal,W. EDMOND ? ? ? ? ?

## 2021-09-05 NOTE — Anesthesia Procedure Notes (Signed)
Anesthesia Regional Block: Supraclavicular block  ? ?Pre-Anesthetic Checklist: , timeout performed,  Correct Patient, Correct Site, Correct Laterality,  Correct Procedure, Correct Position, site marked,  Risks and benefits discussed,  Pre-op evaluation,  At surgeon's request and post-op pain management ? ?Laterality: Left ? ?Prep: Maximum Sterile Barrier Precautions used, chloraprep     ?  ?Needles:  ?Injection technique: Single-shot ? ?Needle Type: Echogenic Stimulator Needle   ? ? ?Needle Length: 9cm  ?Needle Gauge: 21  ? ? ? ?Additional Needles: ? ? ?Procedures:,,,, ultrasound used (permanent image in chart),,    ?Narrative:  ?Start time: 09/05/2021 7:05 AM ?End time: 09/05/2021 7:15 AM ?Injection made incrementally with aspirations every 5 mL. ? ?Performed by: Personally  ?Anesthesiologist: Roderic Palau, MD ? ? ? ? ?

## 2021-09-05 NOTE — Op Note (Signed)
DATE OF SERVICE: 09/05/2021 ? ?PATIENT:  Marcus [name]  66 y.o. male ? ?PRE-OPERATIVE DIAGNOSIS:  ESRD ? ?POST-OPERATIVE DIAGNOSIS:  Same ? ?PROCEDURE:   ?Left proximal (at antecubitum) radial-cephalic arteriovenous fistula creation. ? ?SURGEON:  Surgeon(s) and Role: ?   * Cherre Robins, MD - Primary ? ?ASSISTANT: Leontine Locket, PA-C ? ?An experienced assistant was required given the complexity of this procedure and the standard of surgical care. My assistant helped with exposure through counter tension, suctioning, ligation and retraction to better visualize the surgical field.  My assistant expedited sewing during the case by following my sutures. Wherever I use the term "we" in the report, my assistant actively helped me with that portion of the procedure. ? ?ANESTHESIA:   general ? ?EBL: minimal ? ?BLOOD ADMINISTERED:none ? ?DRAINS: none  ? ?LOCAL MEDICATIONS USED:  NONE ? ?SPECIMEN:  none ? ?COUNTS: confirmed correct. ? ?TOURNIQUET:  none ? ?PATIENT DISPOSITION:  PACU - hemodynamically stable. ?  ?Delay start of Pharmacological VTE agent (>24hrs) due to surgical blood loss or risk of bleeding: no ? ?INDICATION FOR PROCEDURE: Marcus [name] is a 66 y.o. male with ESRD in need of permanent dialysis access. After careful discussion of risks, benefits, and alternatives the patient was offered left arm arteriovenous fistula vs graft. The patient  understood and wished to proceed. ? ?OPERATIVE FINDINGS: healthy cephalic vein. High bifurcation of brachial artery (above antecubital crease). Proximal radial-cephalic fistula created. Good doppler bruit. Good doppler signal at radial artery at completion. ? ?DESCRIPTION OF PROCEDURE: After identification of the patient in the pre-operative holding area, the patient was transferred to the operating room. The patient was positioned supine on the operating room table. Anesthesia was induced. The left arm was prepped and draped in standard  fashion. A surgical pause was performed confirming correct patient, procedure, and operative location. ? ?Using intraoperative ultrasound, the course of the left upper extremity superficial veins was mapped.  The cephalic vein appeared adequate for arteriovenous fistula creation.  The brachial artery was similarly mapped.  The artery appeared adequate for arterial venous creation. A high brachial bifurcation was noted. The radial artery sat just below the cephalic vein in the antecubital fossa.  ? ?A transverse incision was made in the left arm just distal to the antecubital crease.  Incision was carried down through subcutaneous tissue until the cephalic vein was identified and skeletonized.  We continued our exposure through the aponeurosis of the biceps.  The radial artery was encountered..  The artery was circumferentially exposed and encircled with Silastic Vesseloops. ? ?Patient was systemically heparinized.  The distal cephalic vein was transected.  The distal stump of the cephalic vein was oversewn with a 2-0 silk suture.  The proximal vein was controlled with a bulldog clamp.  The brachial artery was clamped proximally medially.  An anterior arteriotomy was made with a 11 blade.  The arteriotomy was extended with Potts scissors.  Using a parachute technique the cephalic vein was anastomosed to the brachial arteriotomy in end-to-side fashion with continuous running suture of 6-0 Prolene.  Immediately prior to completion the anastomosis was flushed and de-aired.  The anastomosis was completed.  Hemostasis was assured. ? ?The fistula was interrogated with Doppler.  Audible bruit was heard throughout the course of the cephalic vein.  A left radial artery signal was heard which augmented slightly with compression of the fistula. ? ?Upon completion of the case instrument and sharps counts were confirmed correct. The patient was transferred  to the PACU in good condition. I was present for all portions of the  procedure. ? ?Yevonne Aline. Stanford Breed, MD ?[facility] [place] ?Office Phone Number: [phone] ?09/05/2021 8:44 AM ? ? ? ?

## 2021-09-05 NOTE — H&P (Signed)
[facility] [place] ? ?ASSESSMENT / PLAN: ?66 y.o. male with new diagnosis of ESRD. Plan left arm arteriovenous graft or fistula today in OR. ? ?CHIEF COMPLAINT: ESRD ? ?HISTORY OF PRESENT ILLNESS: ?Marcus [name] is a 66 y.o. male with new diagnosis of end-stage renal disease.  The patient has had a tunneled dialysis catheter placed.  He has undergone 1 treatment of hemodialysis.  An outpatient plan for dialysis is being created for him.  Patient is right-handed.  He has undergone right rotator cuff surgery.  This was complicated by 2 episodes of what sounds like asystole, per the patient and his family.  He is understandably nervous about undergoing another anesthetic and surgery.  He has never had any other surgery on his upper extremities.  He has never had a pacemaker.  He has never had a central venous catheter prior to his tunneled dialysis catheter. ?  ? ?Past Medical History:  ?Diagnosis Date  ? Anemia   ? Arthritis   ? Chronic kidney disease   ? Complication of anesthesia   ? Stopped breathing during rotator cuff surgery  ? Diabetes mellitus without complication ([place])   ? ESRD (end stage renal disease) ([place])   ? [employer] started 07/2021  ? Hypertension   ? ? ?Past Surgical History:  ?Procedure Laterality Date  ? HERNIA REPAIR    ? IR FLUORO GUIDE CV LINE RIGHT  07/17/2021  ? IR US GUIDE VASC ACCESS RIGHT  07/17/2021  ? ROTATOR CUFF REPAIR Right   ? ? ?Family History  ?Problem Relation Age of Onset  ? CAD Mother   ? Hypertension Sister   ? Renal Disease Sister   ? ? ?Social History  ? ?Socioeconomic History  ? Marital status: Married  ?  Spouse name: Not on file  ? Number of children: Not on file  ? Years of education: Not on file  ? Highest education level: Not on file  ?Occupational History  ? Not on file  ?Tobacco Use  ? Smoking status: Never  ? Smokeless tobacco: Never  ?Vaping Use  ? Vaping Use: Never used  ?Substance and Sexual Activity  ? Alcohol use: Never   ? Drug use: Never  ? Sexual activity: Not on file  ?Other Topics Concern  ? Not on file  ?Social History Narrative  ? Not on file  ? ?Social Determinants of Health  ? ?Financial Resource Strain: Not on file  ?Food Insecurity: Not on file  ?Transportation Needs: Not on file  ?Physical Activity: Not on file  ?Stress: Not on file  ?Social Connections: Not on file  ?Intimate Partner Violence: Not on file  ? ? ?Allergies  ?Allergen Reactions  ? Amlodipine Swelling and Other (See Comments)  ?  Swelling of lower limbs  ? ? ?Current Facility-Administered Medications  ?Medication Dose Route Frequency Provider Last Rate Last Admin  ? 0.9 %  sodium chloride infusion  100 mL Intravenous PRN Rosita Fire, MD      ? 0.9 %  sodium chloride infusion  100 mL Intravenous PRN Rosita Fire, MD      ? 0.9 %  sodium chloride infusion   Intravenous Continuous Cherre Robins, MD      ? alteplase (CATHFLO ACTIVASE) injection 2 mg  2 mg Intracatheter Once PRN Rosita Fire, MD      ? ceFAZolin (ANCEF) 2-4 GM/100ML-% IVPB           ? ceFAZolin (  ANCEF) IVPB 2g/100 mL premix  2 g Intravenous 30 min Pre-Op Cherre Robins, MD      ? chlorhexidine (HIBICLENS) 4 % liquid 4 application.  60 mL Topical Once Cherre Robins, MD      ? And  ? [START ON 09/06/2021] chlorhexidine (HIBICLENS) 4 % liquid 4 application.  60 mL Topical Once Cherre Robins, MD      ? fentaNYL (SUBLIMAZE) 100 MCG/2ML injection           ? heparin injection 1,000 Units  1,000 Units Dialysis PRN Rosita Fire, MD      ? heparin injection 2,500 Units  20 Units/kg Dialysis PRN Rosita Fire, MD      ? insulin aspart (novoLOG) injection 0-7 Units  0-7 Units Subcutaneous Q2H PRN Roderic Palau, MD      ? lidocaine (PF) (XYLOCAINE) 1 % injection 5 mL  5 mL Intradermal PRN Rosita Fire, MD      ? lidocaine-prilocaine (EMLA) cream 1 application.  1 application. Topical PRN Rosita Fire, MD      ? midazolam (VERSED)  2 MG/2ML injection           ? pentafluoroprop-tetrafluoroeth (GEBAUERS) aerosol 1 application.  1 application. Topical PRN Rosita Fire, MD      ? ?Facility-Administered Medications Ordered in Other Encounters  ?Medication Dose Route Frequency Provider Last Rate Last Admin  ? bupivacaine(PF) (MARCAINE) 0.5 % injection   Peri-NEURAL Anesthesia Intra-op Roderic Palau, MD   30 mL at 09/05/21 0715  ? ? ?PHYSICAL EXAM ?Vitals:  ? 09/03/21 1832 09/05/21 0555  ?BP:  (!) 167/88  ?Pulse:  80  ?Resp:  18  ?Temp:  98.9 ?F (37.2 ?C)  ?TempSrc:  Oral  ?SpO2:  98%  ?Weight: 118.4 kg 118.4 kg  ?Height: 5' 6.5" (1.689 m) 5' 6.5" (1.689 m)  ? ?Constitutional: well appearing. no distress. Appears well nourished.  ?Neurologic: CN intact. no focal findings. no sensory loss. ?Psychiatric:  Mood and affect symmetric and appropriate. ?Eyes:  No icterus. No conjunctival pallor. ?Ears, nose, throat:  mucous membranes moist. Midline trachea.  ?Cardiac: regular rate and rhythm.  ?Respiratory:  unlabored. ?Abdominal:  soft, non-tender, non-distended.  ?Peripheral vascular: 2+ radial pulses ?Extremity: no edema. no cyanosis. No pallor.  ?Skin: no gangrene. no ulceration.  ?Lymphatic: no Stemmer's sign. no palpable lymphadenopathy. ? ?PERTINENT LABORATORY AND RADIOLOGIC DATA ? ?Most recent CBC ? ?  Latest Ref Rng & Units 09/05/2021  ?  6:37 AM 07/20/2021  ? 11:32 AM 07/19/2021  ?  1:18 AM  ?CBC  ?WBC 4.0 - 10.5 K/uL  6.8   6.4    ?Hemoglobin 13.0 - 17.0 g/dL 10.9   8.6   8.1    ?Hematocrit 39.0 - 52.0 % 32.0   26.3   24.6    ?Platelets 150 - 400 K/uL  169   179    ?  ? ?Most recent CMP ? ?  Latest Ref Rng & Units 09/05/2021  ?  6:37 AM 07/20/2021  ? 11:32 AM 07/19/2021  ?  1:18 AM  ?CMP  ?Glucose 70 - 99 mg/dL 179   275   154    ?BUN 8 - 23 mg/dL 26   63   92    ?Creatinine 0.61 - 1.24 mg/dL 7.10   11.22   13.44    ?Sodium 135 - 145 mmol/L 140   136   141    ?Potassium 3.5 - 5.1 mmol/L  3.9   3.5   4.3    ?Chloride 98 - 111 mmol/L 100    103   108    ?CO2 22 - 32 mmol/L  23   21    ?Calcium 8.9 - 10.3 mg/dL  7.8   7.2    ? ? ?Renal function ?Estimated Creatinine Clearance: 13 mL/min (A) (by C-G formula based on SCr of 7.1 mg/dL (H)). ? ?Hgb A1c MFr Bld (%)  ?Date Value  ?07/16/2021 5.0  ? ? ?No results found for: LDLCALC, LDLC, HIRISKLDL, POCLDL, LDLDIRECT, REALLDLC, TOTLDLC  ? ?Vascular Imaging: ?Suitable left cephalic vein for fistula creation. ? ?Yevonne Aline. Stanford Breed, MD ?[facility] [place] ?Office Phone Number: [phone] ?09/05/2021 7:23 AM ? ?Total time spent on preparing this encounter including chart review, data review, collecting history, examining the patient, coordinating care for this established patient, 40 minutes. ? ?Portions of this report may have been transcribed using voice recognition software.  Every effort has been made to ensure accuracy; however, inadvertent computerized transcription errors may still be present. ? ? ? ?

## 2021-09-05 NOTE — Transfer of Care (Signed)
Immediate Anesthesia Transfer of Care Note ? ?Patient: Marcus [name] ? ?Procedure(s) Performed: LEFT ARM  RADIOCEPHALIC ARTERIOVENOUS (AV) FISTULA CREATION (Left: Arm Upper) ? ?Patient Location: PACU ? ?Anesthesia Type:MAC and Regional ? ?Level of Consciousness: awake, alert  and oriented ? ?Airway & Oxygen Therapy: Patient Spontanous Breathing ? ?Post-op Assessment: Report given to RN and Post -op Vital signs reviewed and stable ? ?Post vital signs: Reviewed and stable ? ?Last Vitals:  ?Vitals Value Taken Time  ?BP 140/79 09/05/21 0845  ?Temp    ?Pulse 76 09/05/21 0848  ?Resp 6 09/05/21 0848  ?SpO2 96 % 09/05/21 0848  ?Vitals shown include unvalidated device data. ? ?Last Pain:  ?Vitals:  ? 09/05/21 0639  ?TempSrc:   ?PainSc: 0-No pain  ?   ? ?  ? ?Complications: No notable events documented. ?

## 2021-09-05 NOTE — Discharge Instructions (Signed)
? ?  [facility] [place] ? ?Discharge Instructions ? ?AV Fistula or Graft Surgery for Dialysis Access ? ?Please refer to the following instructions for your post-procedure care. Your surgeon or physician assistant will discuss any changes with you. ? ?Activity ? ?You may drive the day following your surgery, if you are comfortable and no longer taking prescription pain medication. Resume full activity as the soreness in your incision resolves. ? ?Bathing/Showering ? ?You may shower after you go home. Keep your incision dry for 48 hours. Do not soak in a bathtub, hot tub, or swim until the incision heals completely. You may not shower if you have a hemodialysis catheter. ? ?Incision Care ? ?Clean your incision with mild soap and water after 48 hours. Pat the area dry with a clean towel. You do not need a bandage unless otherwise instructed. Do not apply any ointments or creams to your incision. You may have skin glue on your incision. Do not peel it off. It will come off on its own in about one week. Your arm may swell a bit after surgery. To reduce swelling use pillows to elevate your arm so it is above your heart. Your doctor will tell you if you need to lightly wrap your arm with an ACE bandage. ? ?Diet ? ?Resume your normal diet. There are not special food restrictions following this procedure. In order to heal from your surgery, it is CRITICAL to get adequate nutrition. Your body requires vitamins, minerals, and protein. Vegetables are the best source of vitamins and minerals. Vegetables also provide the perfect balance of protein. Processed food has little nutritional value, so try to avoid this. ? ?Medications ? ?Resume taking all of your medications. If your incision is causing pain, you may take over-the counter pain relievers such as acetaminophen (Tylenol). If you were prescribed a stronger pain medication, please be aware these medications can cause nausea and constipation. Prevent  nausea by taking the medication with a snack or meal. Avoid constipation by drinking plenty of fluids and eating foods with high amount of fiber, such as fruits, vegetables, and grains.  ?Do not take Tylenol if you are taking prescription pain medications. ? ?Follow up ?Your surgeon may want to see you in the office following your access surgery. If so, this will be arranged at the time of your surgery. ? ?Please call us immediately for any of the following conditions: ? ?Increased pain, redness, drainage (pus) from your incision site ?Fever of 101 degrees or higher ?Severe or worsening pain at your incision site ?Hand pain or numbness. ? ?Reduce your risk of vascular disease: ? ?Stop smoking. If you would like help, call [facility] at 1-800-QUIT-NOW (347) 238-6273) or [place] at 2798228099 ? ?Manage your cholesterol ?Maintain a desired weight ?Control your diabetes ?Keep your blood pressure down ? ?Dialysis ? ?It will take several weeks to several months for your new dialysis access to be ready for use. Your surgeon will determine when it is okay to use it. Your nephrologist will continue to direct your dialysis. You can continue to use your Permcath until your new access is ready for use. ? ? ?09/05/2021 ?[place] ?[id number] ?January 22, 1956 ? ?Surgeon(s): ?Cherre Robins, MD ? ?Procedure(s): ?Creation of left brachiocephalic AV fistula ? ?x Do not stick fistula for 12 weeks  ? ? ?If you have any questions, please call the office at [phone]. ? ?

## 2021-09-06 ENCOUNTER — Encounter (HOSPITAL_COMMUNITY): Payer: Self-pay | Admitting: Vascular Surgery

## 2021-10-01 ENCOUNTER — Other Ambulatory Visit: Payer: Self-pay

## 2021-10-01 DIAGNOSIS — N179 Acute kidney failure, unspecified: Secondary | ICD-10-CM

## 2021-10-14 NOTE — Progress Notes (Signed)
[facility] [place] ? ?ASSESSMENT / PLAN: ?66 y.o. male with ESRD dialyzing MWF via RIJ TDC. I created a left brachiocephalic arteriovenous fistula 09/05/2021.  This has been slow to mature.  Duplex today shows a large competing branch in the mid arm.  Plan revision of AV fistula with sidebranch ligation 10/17/2021. ? ?CHIEF COMPLAINT: ESRD ? ?HISTORY OF PRESENT ILLNESS: ?Marcus [name] is a 66 y.o. male with new diagnosis of end-stage renal disease.  The patient has had a tunneled dialysis catheter placed.  He has undergone 1 treatment of hemodialysis.  An outpatient plan for dialysis is being created for him.  Patient is right-handed.  He has undergone right rotator cuff surgery.  This was complicated by 2 episodes of what sounds like asystole, per the patient and his family.  He is understandably nervous about undergoing another anesthetic and surgery.  He has never had any other surgery on his upper extremities.  He has never had a pacemaker.  He has never had a central venous catheter prior to his tunneled dialysis catheter. ? ?10/15/21: Patient returns to clinic doing well.  He feels overall improved after initiating dialysis.  He is tolerating dialysis reasonably well via right IJ tunneled line on Mondays, Wednesdays, and Fridays.  He is hopeful to transition to using his fistula soon. ?  ? ?Past Medical History:  ?Diagnosis Date  ? Anemia   ? Arthritis   ? Chronic kidney disease   ? Complication of anesthesia   ? Stopped breathing during rotator cuff surgery  ? Diabetes mellitus without complication ([place])   ? ESRD (end stage renal disease) ([place])   ? MWF [employer] started 07/2021  ? Hypertension   ? ? ?Past Surgical History:  ?Procedure Laterality Date  ? AV FISTULA PLACEMENT Left 09/05/2021  ? Procedure: LEFT ARM  RADIOCEPHALIC ARTERIOVENOUS (AV) FISTULA CREATION;  Surgeon: Cherre Robins, MD;  Location: [facility] OR;  Service: Vascular;  Laterality: Left;  PERIPHERAL NERVE  BLOCK  ? HERNIA REPAIR    ? IR FLUORO GUIDE CV LINE RIGHT  07/17/2021  ? IR US GUIDE VASC ACCESS RIGHT  07/17/2021  ? ROTATOR CUFF REPAIR Right   ? ? ?Family History  ?Problem Relation Age of Onset  ? CAD Mother   ? Hypertension Sister   ? Renal Disease Sister   ? ? ?Social History  ? ?Socioeconomic History  ? Marital status: Married  ?  Spouse name: Not on file  ? Number of children: Not on file  ? Years of education: Not on file  ? Highest education level: Not on file  ?Occupational History  ? Not on file  ?Tobacco Use  ? Smoking status: Never  ? Smokeless tobacco: Never  ?Vaping Use  ? Vaping Use: Never used  ?Substance and Sexual Activity  ? Alcohol use: Never  ? Drug use: Never  ? Sexual activity: Not on file  ?Other Topics Concern  ? Not on file  ?Social History Narrative  ? Not on file  ? ?Social Determinants of Health  ? ?Financial Resource Strain: Not on file  ?Food Insecurity: Not on file  ?Transportation Needs: Not on file  ?Physical Activity: Not on file  ?Stress: Not on file  ?Social Connections: Not on file  ?Intimate Partner Violence: Not on file  ? ? ?Allergies  ?Allergen Reactions  ? Amlodipine Swelling and Other (See Comments)  ?  Swelling of lower limbs  ? ? ?Current Outpatient Medications  ?Medication  Sig Dispense Refill  ? acetaminophen (TYLENOL) 325 MG tablet Take 650 mg by mouth every 6 (six) hours as needed for moderate pain or headache.    ? albuterol (VENTOLIN HFA) 108 (90 Base) MCG/ACT inhaler Inhale 1-2 puffs into the lungs every 6 (six) hours as needed for wheezing or shortness of breath. (Patient not taking: Reported on 09/02/2021) 8 g 0  ? atorvastatin (LIPITOR) 20 MG tablet Take 10 mg by mouth at bedtime.    ? B Complex-C-Folic Acid (DIALYVITE TABLET) TABS Take 1 tablet by mouth daily.    ? budesonide-formoterol (SYMBICORT) 160-4.5 MCG/ACT inhaler Inhale 2 puffs into the lungs in the morning and at bedtime. (Patient taking differently: Inhale 2 puffs into the lungs daily as needed  (Wheezing).) 10.2 g 0  ? carvedilol (COREG) 25 MG tablet Take 25 mg by mouth 2 (two) times daily with a meal.    ? Cholecalciferol 25 MCG (1000 UT) TBDP Take 1,000 Units by mouth daily.    ? diclofenac Sodium (VOLTAREN) 1 % GEL Apply 2 g topically 4 (four) times daily as needed (pain).    ? gabapentin (NEURONTIN) 100 MG capsule Take 100 mg by mouth 2 (two) times daily.    ? hydrALAZINE (APRESOLINE) 25 MG tablet Take 1 tablet (25 mg total) by mouth every 8 (eight) hours. 90 tablet 0  ? HYDROcodone-acetaminophen (NORCO/VICODIN) 5-325 MG tablet Take 1 tablet by mouth every 6 (six) hours as needed. 8 tablet 0  ? losartan (COZAAR) 100 MG tablet Take 100 mg by mouth at bedtime.    ? multivitamin (RENA-VIT) TABS tablet Take 1 tablet by mouth at bedtime. (Patient not taking: Reported on 09/02/2021) 30 tablet 0  ? Nutritional Supplements (FEEDING SUPPLEMENT, NEPRO CARB STEADY,) LIQD Take 237 mLs by mouth daily.  0  ? sevelamer carbonate (RENVELA) 800 MG tablet Take 1 tablet (800 mg total) by mouth 3 (three) times daily with meals. 90 tablet 1  ? ?No current facility-administered medications for this visit.  ? ? ?PHYSICAL EXAM ?There were no vitals filed for this visit. ? ?Constitutional: well appearing. no distress. Appears well nourished.  ?Neurologic: CN intact. no focal findings. no sensory loss. ?Psychiatric:  Mood and affect symmetric and appropriate. ?Eyes:  No icterus. No conjunctival pallor. ?Ears, nose, throat:  mucous membranes moist. Midline trachea.  ?Cardiac: regular rate and rhythm.  ?Respiratory:  unlabored. ?Abdominal:  soft, non-tender, non-distended.  ?Peripheral vascular: 2+ radial pulses ?Extremity: no edema. no cyanosis. No pallor.  ?Skin: no gangrene. no ulceration.  ?Lymphatic: no Stemmer's sign. no palpable lymphadenopathy. ? ?PERTINENT LABORATORY AND RADIOLOGIC DATA ? ?Most recent CBC ? ?  Latest Ref Rng & Units 09/05/2021  ?  6:37 AM 07/20/2021  ? 11:32 AM 07/19/2021  ?  1:18 AM  ?CBC  ?WBC 4.0 - 10.5  K/uL  6.8   6.4    ?Hemoglobin 13.0 - 17.0 g/dL 10.9   8.6   8.1    ?Hematocrit 39.0 - 52.0 % 32.0   26.3   24.6    ?Platelets 150 - 400 K/uL  169   179    ?  ? ?Most recent CMP ? ?  Latest Ref Rng & Units 09/05/2021  ?  6:37 AM 07/20/2021  ? 11:32 AM 07/19/2021  ?  1:18 AM  ?CMP  ?Glucose 70 - 99 mg/dL 179   275   154    ?BUN 8 - 23 mg/dL 26   63   92    ?Creatinine  0.61 - 1.24 mg/dL 7.10   11.22   13.44    ?Sodium 135 - 145 mmol/L 140   136   141    ?Potassium 3.5 - 5.1 mmol/L 3.9   3.5   4.3    ?Chloride 98 - 111 mmol/L 100   103   108    ?CO2 22 - 32 mmol/L  23   21    ?Calcium 8.9 - 10.3 mg/dL  7.8   7.2    ? ? ?Renal function ?CrCl cannot be calculated (Patient's most recent lab result is older than the maximum 21 days allowed.). ? ?Hgb A1c MFr Bld (%)  ?Date Value  ?07/16/2021 5.0  ? ? ?No results found for: LDLCALC, LDLC, HIRISKLDL, POCLDL, LDLDIRECT, REALLDLC, TOTLDLC  ? ?Vascular Imaging: ?Duplex ultrasound of the AV fistula shows low flow rate and large competing branch in the fistula. ? ?Yevonne Aline. Stanford Breed, MD ?[facility] [place] ?Office Phone Number: [phone] ?10/14/2021 12:23 PM ? ?Total time spent on preparing this encounter including chart review, data review, collecting history, examining the patient, coordinating care for this established patient, 40 minutes. ? ?Portions of this report may have been transcribed using voice recognition software.  Every effort has been made to ensure accuracy; however, inadvertent computerized transcription errors may still be present. ? ? ? ?

## 2021-10-15 ENCOUNTER — Encounter: Payer: Self-pay | Admitting: Vascular Surgery

## 2021-10-15 ENCOUNTER — Ambulatory Visit (HOSPITAL_COMMUNITY)
Admission: RE | Admit: 2021-10-15 | Discharge: 2021-10-15 | Disposition: A | Discharge status: Home / Self Care | Payer: No Typology Code available for payment source | Source: Ambulatory Visit | Attending: Vascular Surgery | Admitting: Vascular Surgery

## 2021-10-15 ENCOUNTER — Ambulatory Visit (INDEPENDENT_AMBULATORY_CARE_PROVIDER_SITE_OTHER): Payer: No Typology Code available for payment source | Admitting: Vascular Surgery

## 2021-10-15 ENCOUNTER — Other Ambulatory Visit: Payer: Self-pay

## 2021-10-15 VITALS — BP 170/86 | HR 70 | Temp 98.3°F | Resp 20 | Ht 66.5 in | Wt 262.0 lb

## 2021-10-15 DIAGNOSIS — N179 Acute kidney failure, unspecified: Secondary | ICD-10-CM | POA: Diagnosis present

## 2021-10-15 DIAGNOSIS — Z992 Dependence on renal dialysis: Secondary | ICD-10-CM | POA: Diagnosis not present

## 2021-10-15 DIAGNOSIS — N186 End stage renal disease: Secondary | ICD-10-CM

## 2021-10-16 ENCOUNTER — Other Ambulatory Visit: Payer: Self-pay

## 2021-10-16 ENCOUNTER — Encounter (HOSPITAL_COMMUNITY): Payer: Self-pay | Admitting: Vascular Surgery

## 2021-10-16 NOTE — Progress Notes (Signed)
PCP - [facility] ? ?Cardiologist - Denies ? ?EP- Denies ? ?Endocrine- Denies ? ?Pulm- Denies ? ?Chest x-ray - 07/16/21 (E) ? ?EKG - 07/16/21 (E) ? ?Stress Test - Denies ? ?ECHO - 07/17/21 (E) ? ?Cardiac Cath - Denies ? ?AICD-na ?PM-na ?LOOP-na ? ?Nerve Stimulator- Denies ? ?Dialysis- M-W-F, had a full session on 5/10 ? ?Sleep Study - Yes- Positive ?CPAP - Denies ? ?LABS- 10/17/21: I-Stat-8 ? ?ASA- Denies ? ?ERAS- No ? ?HA1C- 07/16/21(E): 5.0 ?Fasting Blood Sugar - 68-140 ?Checks Blood Sugar ___2-3__ times a week ? ?Anesthesia- No ? ?Pt denies having chest pain, sob, or fever during the pre-op phone call. All instructions explained to the pt, with a verbal understanding of the material including: as of today,  stop taking all Aspirin (unless instructed by your doctor) and Other Aspirin containing products, Vitamins, Fish oils, and Herbal medications. Also stop all NSAIDS i.e. Advil, Ibuprofen, Motrin, Aleve, Anaprox, Naproxen, BC, Goody Powders, and all Supplements.  ? ?If your CBG is greater than 220 mg/dL,call the number below for further instructions. ? ?How do I manage my blood sugar before surgery? ?Check your blood sugar at least 4 times a day, starting 2 days before surgery, to make sure that the level is not too high or low. ?Check your blood sugar the morning of your surgery when you wake up and every 2 hours until you get to the Short Stay unit. ?If your blood sugar is less than 70 mg/dL, you will need to treat for low blood sugar: ?Do not take insulin. ?Treat a low blood sugar (less than 70 mg/dL) with ? cup of clear juice (cranberry or apple), 4 glucose tablets, OR glucose gel. ?Recheck blood sugar in 15 minutes after treatment (to make sure it is greater than 70 mg/dL). If your blood sugar is not greater than 70 mg/dL on recheck, call [phone] ? for further instructions. ?Report your blood sugar to the short stay nurse when you get to Short Stay. ? ?Reviewed and Endorsed by [facility] Patient Education  Committee, August 2015 ? ? Pt also instructed to wear a mask and social distance if he goes out. The opportunity to ask questions was provided.  ?

## 2021-10-17 ENCOUNTER — Ambulatory Visit (HOSPITAL_COMMUNITY)
Admission: RE | Admit: 2021-10-17 | Discharge: 2021-10-17 | Disposition: A | Discharge status: Home / Self Care | Payer: No Typology Code available for payment source | Attending: Vascular Surgery | Admitting: Vascular Surgery

## 2021-10-17 ENCOUNTER — Other Ambulatory Visit: Payer: Self-pay

## 2021-10-17 ENCOUNTER — Encounter (HOSPITAL_COMMUNITY): Payer: Self-pay | Admitting: Vascular Surgery

## 2021-10-17 ENCOUNTER — Ambulatory Visit (HOSPITAL_BASED_OUTPATIENT_CLINIC_OR_DEPARTMENT_OTHER): Payer: No Typology Code available for payment source | Admitting: Certified Registered Nurse Anesthetist

## 2021-10-17 ENCOUNTER — Encounter (HOSPITAL_COMMUNITY)
Admission: RE | Disposition: A | Discharge status: Home / Self Care | Payer: Self-pay | Source: Home / Self Care | Attending: Vascular Surgery

## 2021-10-17 ENCOUNTER — Ambulatory Visit (HOSPITAL_COMMUNITY): Payer: No Typology Code available for payment source | Admitting: Certified Registered Nurse Anesthetist

## 2021-10-17 DIAGNOSIS — N186 End stage renal disease: Secondary | ICD-10-CM

## 2021-10-17 DIAGNOSIS — T82898A Other specified complication of vascular prosthetic devices, implants and grafts, initial encounter: Secondary | ICD-10-CM | POA: Diagnosis not present

## 2021-10-17 DIAGNOSIS — E1122 Type 2 diabetes mellitus with diabetic chronic kidney disease: Secondary | ICD-10-CM | POA: Diagnosis not present

## 2021-10-17 DIAGNOSIS — Z992 Dependence on renal dialysis: Secondary | ICD-10-CM

## 2021-10-17 DIAGNOSIS — I120 Hypertensive chronic kidney disease with stage 5 chronic kidney disease or end stage renal disease: Secondary | ICD-10-CM | POA: Diagnosis not present

## 2021-10-17 HISTORY — DX: Sleep apnea, unspecified: G47.30

## 2021-10-17 HISTORY — PX: REVISON OF ARTERIOVENOUS FISTULA: SHX6074

## 2021-10-17 LAB — GLUCOSE, CAPILLARY
Glucose-Capillary: 216 mg/dL — ABNORMAL HIGH (ref 70–99)
Glucose-Capillary: 191 mg/dL — ABNORMAL HIGH (ref 70–99)
Glucose-Capillary: 159 mg/dL — ABNORMAL HIGH (ref 70–99)

## 2021-10-17 LAB — POCT I-STAT, CHEM 8
BUN: 32 mg/dL — ABNORMAL HIGH (ref 8–23)
Calcium, Ion: 0.95 mmol/L — ABNORMAL LOW (ref 1.15–1.40)
Chloride: 96 mmol/L — ABNORMAL LOW (ref 98–111)
Creatinine, Ser: 7.5 mg/dL — ABNORMAL HIGH (ref 0.61–1.24)
Glucose, Bld: 208 mg/dL — ABNORMAL HIGH (ref 70–99)
HCT: 34 % — ABNORMAL LOW (ref 39.0–52.0)
Hemoglobin: 11.6 g/dL — ABNORMAL LOW (ref 13.0–17.0)
Potassium: 4 mmol/L (ref 3.5–5.1)
Sodium: 134 mmol/L — ABNORMAL LOW (ref 135–145)
TCO2: 27 mmol/L (ref 22–32)

## 2021-10-17 SURGERY — REVISON OF ARTERIOVENOUS FISTULA
Anesthesia: Monitor Anesthesia Care | Site: Arm Upper | Laterality: Left

## 2021-10-17 MED ORDER — CEFAZOLIN SODIUM-DEXTROSE 2-4 GM/100ML-% IV SOLN
2.0000 g | INTRAVENOUS | Status: AC
  Administered 2021-10-17: 2 g via INTRAVENOUS
  Filled 2021-10-17: qty 100

## 2021-10-17 MED ORDER — SODIUM CHLORIDE 0.9 % IV SOLN: INTRAVENOUS | Status: DC

## 2021-10-17 MED ORDER — CHLORHEXIDINE GLUCONATE 4 % EX LIQD: 60.0000 mL | Freq: Once | CUTANEOUS | Status: DC

## 2021-10-17 MED ORDER — INSULIN ASPART 100 UNIT/ML IJ SOLN
0.0000 [IU] | INTRAMUSCULAR | Status: DC | PRN
  Administered 2021-10-17: 2 [IU] via SUBCUTANEOUS
  Filled 2021-10-17: qty 1

## 2021-10-17 MED ORDER — FENTANYL CITRATE (PF) 100 MCG/2ML IJ SOLN: 25.0000 ug | INTRAMUSCULAR | Status: DC | PRN

## 2021-10-17 MED ORDER — CHLORHEXIDINE GLUCONATE 4 % EX LIQD
60.0000 mL | Freq: Once | CUTANEOUS | Status: DC
Start: 2021-10-17 — End: 2021-10-17

## 2021-10-17 MED ORDER — MIDAZOLAM HCL 2 MG/2ML IJ SOLN: 1.0000 mg | Freq: Once | INTRAMUSCULAR | Status: AC

## 2021-10-17 MED ORDER — FENTANYL CITRATE (PF) 100 MCG/2ML IJ SOLN: 50.0000 ug | Freq: Once | INTRAMUSCULAR | Status: AC

## 2021-10-17 MED ORDER — 0.9 % SODIUM CHLORIDE (POUR BTL) OPTIME
TOPICAL | Status: DC | PRN
  Administered 2021-10-17: 1000 mL

## 2021-10-17 MED ORDER — PROPOFOL 500 MG/50ML IV EMUL
INTRAVENOUS | Status: DC | PRN
  Administered 2021-10-17: 50 ug/kg/min via INTRAVENOUS

## 2021-10-17 MED ORDER — MIDAZOLAM HCL 2 MG/2ML IJ SOLN
INTRAMUSCULAR | Status: AC
  Administered 2021-10-17: 1 mg via INTRAVENOUS
  Filled 2021-10-17: qty 2

## 2021-10-17 MED ORDER — HEPARIN 6000 UNIT IRRIGATION SOLUTION
Status: DC | PRN
  Administered 2021-10-17: 1

## 2021-10-17 MED ORDER — CHLORHEXIDINE GLUCONATE 0.12 % MT SOLN
15.0000 mL | Freq: Once | OROMUCOSAL | Status: AC
  Administered 2021-10-17: 15 mL via OROMUCOSAL
  Filled 2021-10-17: qty 15

## 2021-10-17 MED ORDER — OXYCODONE HCL 5 MG PO TABS: 5.0000 mg | ORAL_TABLET | Freq: Once | ORAL | Status: DC | PRN

## 2021-10-17 MED ORDER — MEPIVACAINE HCL (PF) 1.5 % IJ SOLN
INTRAMUSCULAR | Status: DC | PRN
  Administered 2021-10-17: 15 mL via PERINEURAL

## 2021-10-17 MED ORDER — HEPARIN 6000 UNIT IRRIGATION SOLUTION
Status: AC
  Filled 2021-10-17: qty 500

## 2021-10-17 MED ORDER — ONDANSETRON HCL 4 MG/2ML IJ SOLN: 4.0000 mg | Freq: Once | INTRAMUSCULAR | Status: DC | PRN

## 2021-10-17 MED ORDER — LIDOCAINE-EPINEPHRINE (PF) 1 %-1:200000 IJ SOLN
INTRAMUSCULAR | Status: AC
  Filled 2021-10-17: qty 30

## 2021-10-17 MED ORDER — ROPIVACAINE HCL 5 MG/ML IJ SOLN
INTRAMUSCULAR | Status: DC | PRN
  Administered 2021-10-17: 15 mL via PERINEURAL

## 2021-10-17 MED ORDER — PAPAVERINE HCL 30 MG/ML IJ SOLN
INTRAMUSCULAR | Status: AC
  Filled 2021-10-17: qty 2

## 2021-10-17 MED ORDER — OXYCODONE HCL 5 MG/5ML PO SOLN: 5.0000 mg | Freq: Once | ORAL | Status: DC | PRN

## 2021-10-17 MED ORDER — FENTANYL CITRATE (PF) 100 MCG/2ML IJ SOLN
INTRAMUSCULAR | Status: AC
Start: 2021-10-17 — End: 2021-10-17
  Administered 2021-10-17: 50 ug via INTRAVENOUS
  Filled 2021-10-17: qty 2

## 2021-10-17 MED ORDER — ORAL CARE MOUTH RINSE: 15.0000 mL | Freq: Once | OROMUCOSAL | Status: AC

## 2021-10-17 SURGICAL SUPPLY — 37 items
ARMBAND PINK RESTRICT EXTREMIT (MISCELLANEOUS) ×2 IMPLANT
BAG COUNTER SPONGE SURGICOUNT (BAG) ×2 IMPLANT
CANISTER SUCT 3000ML PPV (MISCELLANEOUS) ×2 IMPLANT
CLIP TI MEDIUM 6 (CLIP) ×1 IMPLANT
CLIP TI WIDE RED SMALL 6 (CLIP) ×1 IMPLANT
CLIP VESOCCLUDE MED 6/CT (CLIP) ×2 IMPLANT
CLIP VESOCCLUDE SM WIDE 6/CT (CLIP) ×2 IMPLANT
COVER PROBE W GEL 5X96 (DRAPES) IMPLANT
COVER PROBE W/GEL STERILE (IV SETS) ×1 IMPLANT
DECANTER SPIKE VIAL GLASS SM (MISCELLANEOUS) ×2 IMPLANT
DERMABOND ADVANCED (GAUZE/BANDAGES/DRESSINGS) ×1
DERMABOND ADVANCED .7 DNX12 (GAUZE/BANDAGES/DRESSINGS) ×1 IMPLANT
ELECT REM PT RETURN 9FT ADLT (ELECTROSURGICAL) ×2
ELECTRODE REM PT RTRN 9FT ADLT (ELECTROSURGICAL) ×1 IMPLANT
GLOVE BIO SURGEON STRL SZ7.5 (GLOVE) ×2 IMPLANT
GLOVE BIOGEL PI IND STRL 8 (GLOVE) ×1 IMPLANT
GLOVE BIOGEL PI INDICATOR 8 (GLOVE) ×1
GOWN STRL REUS W/ TWL LRG LVL3 (GOWN DISPOSABLE) ×2 IMPLANT
GOWN STRL REUS W/ TWL XL LVL3 (GOWN DISPOSABLE) ×2 IMPLANT
GOWN STRL REUS W/TWL LRG LVL3 (GOWN DISPOSABLE) ×2
GOWN STRL REUS W/TWL XL LVL3 (GOWN DISPOSABLE) ×2
HEMOSTAT SPONGE AVITENE ULTRA (HEMOSTASIS) IMPLANT
KIT BASIN OR (CUSTOM PROCEDURE TRAY) ×2 IMPLANT
KIT TURNOVER KIT B (KITS) ×2 IMPLANT
NS IRRIG 1000ML POUR BTL (IV SOLUTION) ×2 IMPLANT
PACK CV ACCESS (CUSTOM PROCEDURE TRAY) ×2 IMPLANT
PAD ARMBOARD 7.5X6 YLW CONV (MISCELLANEOUS) ×4 IMPLANT
SLING ARM FOAM STRAP LRG (SOFTGOODS) ×1 IMPLANT
SUT MNCRL AB 4-0 PS2 18 (SUTURE) ×2 IMPLANT
SUT PROLENE 5 0 C 1 24 (SUTURE) IMPLANT
SUT PROLENE 6 0 BV (SUTURE) IMPLANT
SUT PROLENE 7 0 BV 1 (SUTURE) IMPLANT
SUT VIC AB 3-0 SH 27 (SUTURE) ×1
SUT VIC AB 3-0 SH 27X BRD (SUTURE) ×1 IMPLANT
TOWEL GREEN STERILE (TOWEL DISPOSABLE) ×2 IMPLANT
UNDERPAD 30X36 HEAVY ABSORB (UNDERPADS AND DIAPERS) ×2 IMPLANT
WATER STERILE IRR 1000ML POUR (IV SOLUTION) ×2 IMPLANT

## 2021-10-17 NOTE — Anesthesia Procedure Notes (Signed)
Procedure Name: Laurie ?Date/Time: 10/17/2021 1:40 PM ?Performed by: Dorthea Cove, CRNA ?Pre-anesthesia Checklist: Patient identified, Emergency Drugs available, Suction available, Patient being monitored and Timeout performed ?Patient Re-evaluated:Patient Re-evaluated prior to induction ?Oxygen Delivery Method: Simple face mask ?Preoxygenation: Pre-oxygenation with 100% oxygen ?Induction Type: IV induction ?Placement Confirmation: positive ETCO2 and breath sounds checked- equal and bilateral ?Dental Injury: Teeth and Oropharynx as per pre-operative assessment  ? ? ? ? ?

## 2021-10-17 NOTE — Op Note (Signed)
DATE OF SERVICE: 10/17/2021 ? ?PATIENT:  Marcus [name]  66 y.o. male ? ?PRE-OPERATIVE DIAGNOSIS:  ESRD, poorly maturing left brachiocephalic arteriovenous fistula ? ?POST-OPERATIVE DIAGNOSIS:  Same ? ?PROCEDURE:   ?Sidebranch ligation x2 of left brachiocephalic arteriovenous fistula ? ?SURGEON:  Surgeon(s) and Role: ?   * Cherre Robins, MD - Primary ? ?ASSISTANT: Leontine Locket, PAC ? ?An experienced assistant was required given the complexity of this procedure and the standard of surgical care. My assistant helped with exposure through counter tension, suctioning, ligation and retraction to better visualize the surgical field.  My assistant expedited sewing during the case by following my sutures. Wherever I use the term "we" in the report, my assistant actively helped me with that portion of the procedure. ? ?ANESTHESIA:   regional and MAC ? ?EBL: Minimal ? ?BLOOD ADMINISTERED:none ? ?DRAINS: none  ? ?LOCAL MEDICATIONS USED:  NONE ? ?SPECIMEN:  none ? ?COUNTS: confirmed correct. ? ?TOURNIQUET:  none ? ?PATIENT DISPOSITION:  PACU - hemodynamically stable. ?  ?Delay start of Pharmacological VTE agent (>24hrs) due to surgical blood loss or risk of bleeding: no ? ?INDICATION FOR PROCEDURE: Marcus Archie Tomi Paddock is a 66 y.o. male with end-stage renal disease dialyzing through a tunneled dialysis catheter on Monday, Wednesday, and Friday.  I previously created a left brachiocephalic arteriovenous fistula for him on 09/05/2021.  He return the office on 10/15/2021 for follow-up evaluation.  The fistula is small, has a lower than expected flow rate.  There is a large sidebranch in the mid fistula which is likely stealing flow. After careful discussion of risks, benefits, and alternatives the patient was offered sidebranch ligation. The patient understood and wished to proceed. ? ?OPERATIVE FINDINGS: 1 large and 1 small sidebranch identified using duplex.  Both side branches ligated and divided through  a small transverse incisions in the arm. ? ?DESCRIPTION OF PROCEDURE: After identification of the patient in the pre-operative holding area, the patient was transferred to the operating room. The patient was positioned supine on the operating room table. Anesthesia was induced. The left arm was prepped and draped in standard fashion. A surgical pause was performed confirming correct patient, procedure, and operative location. ? ?Intraoperative ultrasound was used to mark the course of the cephalic vein fistula in the left arm.  A large side branch was identified in the mid arm.  A smaller sidebranch was identified over the deltoid.  The large side branch was exposed first 3 small transverse incision in the mid arm.  Incision was carried down through the subcutaneous tissue until the fistula was encountered.  The large side branch was evaluated.  The fistula was interrogated with Doppler to ensure we were not providing the inflow to the fistula.  The sidebranch was ligated with 2-0 silk suture and divided. ? ?A similar approach was taken with the smaller sidebranch in the upper arm.  Incision was carried down through subcutaneous tissue until the fistula was identified.  A small side branch was identified.  This was ligated with 3-0 silk suture proximally distally.  The sidebranch was then divided.  Good thrill was felt throughout the fistula.  This was confirmed with Doppler flow. ? ?The incisions were closed in layers using 3-0 Vicryl and 4-0 Monocryl.  Dermabond was applied. ? ?Upon completion of the case instrument and sharps counts were confirmed correct. The patient was transferred to the PACU in good condition. I was present for all portions of the procedure. ? ?Yevonne Aline. Stanford Breed,  MD ?[facility] [place] ?Office Phone Number: [phone] ?10/17/2021 2:17 PM ? ? ? ?

## 2021-10-17 NOTE — Anesthesia Procedure Notes (Signed)
Anesthesia Procedure Image    

## 2021-10-17 NOTE — Anesthesia Preprocedure Evaluation (Signed)
Anesthesia Evaluation  ?Patient identified by MRN, date of birth, ID band ?Patient awake ? ? ? ?Reviewed: ?Allergy & Precautions, NPO status , Patient's Chart, lab work & pertinent test results ? ?Airway ?Mallampati: II ? ?TM Distance: >3 FB ?Neck ROM: Full ? ? ? Dental ?no notable dental hx. ? ?  ?Pulmonary ?neg pulmonary ROS,  ?  ?Pulmonary exam normal ?breath sounds clear to auscultation ? ? ? ? ? ? Cardiovascular ?hypertension, Normal cardiovascular exam ?Rhythm:Regular Rate:Normal ? ? ?  ?Neuro/Psych ?negative neurological ROS ? negative psych ROS  ? GI/Hepatic ?negative GI ROS, Neg liver ROS,   ?Endo/Other  ?diabetes, Type 2Morbid obesity ? Renal/GU ?DialysisRenal disease  ?negative genitourinary ?  ?Musculoskeletal ?negative musculoskeletal ROS ?(+)  ? Abdominal ?  ?Peds ?negative pediatric ROS ?(+)  Hematology ?negative hematology ROS ?(+)   ?Anesthesia Other Findings ? ? Reproductive/Obstetrics ?negative OB ROS ? ?  ? ? ? ? ? ? ? ? ? ? ? ? ? ?  ?  ? ? ? ? ? ? ? ? ?Anesthesia Physical ?Anesthesia Plan ? ?ASA: 3 ? ?Anesthesia Plan: MAC  ? ?Post-op Pain Management: Regional block*  ? ?Induction: Intravenous ? ?PONV Risk Score and Plan: 1 and Propofol infusion and Treatment may vary due to age or medical condition ? ?Airway Management Planned: Simple Face Mask ? ?Additional Equipment:  ? ?Intra-op Plan:  ? ?Post-operative Plan:  ? ?Informed Consent: I have reviewed the patients History and Physical, chart, labs and discussed the procedure including the risks, benefits and alternatives for the proposed anesthesia with the patient or authorized representative who has indicated his/her understanding and acceptance.  ? ? ? ?Dental advisory given ? ?Plan Discussed with: CRNA and Surgeon ? ?Anesthesia Plan Comments:   ? ? ? ? ? ? ?Anesthesia Quick Evaluation ? ?

## 2021-10-17 NOTE — Transfer of Care (Signed)
Immediate Anesthesia Transfer of Care Note ? ?Patient: Marcus [name] ? ?Procedure(s) Performed: LEFT ARM ARTERIOVENOUS FISTULA LIGATION (Left: Arm Upper) ? ?Patient Location: PACU ? ?Anesthesia Type:Regional ? ?Level of Consciousness: awake, alert  and oriented ? ?Airway & Oxygen Therapy: Patient Spontanous Breathing ? ?Post-op Assessment: Report given to RN and Post -op Vital signs reviewed and stable ? ?Post vital signs: Reviewed and stable ? ?Last Vitals:  ?Vitals Value Taken Time  ?BP 176/92 10/17/21 1423  ?Temp    ?Pulse 66 10/17/21 1425  ?Resp 18 10/17/21 1425  ?SpO2 98 % 10/17/21 1425  ?Vitals shown include unvalidated device data. ? ?Last Pain:  ?Vitals:  ? 10/17/21 1108  ?TempSrc:   ?PainSc: 0-No pain  ?   ? ?Patients Stated Pain Goal: 0 (10/16/21 1648) ? ?Complications: No notable events documented. ?

## 2021-10-17 NOTE — Anesthesia Procedure Notes (Signed)
Anesthesia Regional Block: Supraclavicular block  ? ?Pre-Anesthetic Checklist: , timeout performed,  Correct Patient, Correct Site, Correct Laterality,  Correct Procedure, Correct Position, site marked,  Risks and benefits discussed,  Surgical consent,  Pre-op evaluation,  At surgeon's request and post-op pain management ? ?Laterality: Left ? ?Prep: chloraprep     ?  ?Needles:  ?Injection technique: Single-shot ? ?Needle Type: Echogenic Needle   ? ? ?Needle Length: 9cm  ? ? ? ? ?Additional Needles: ? ? ?Procedures:,,,, ultrasound used (permanent image in chart),,    ?Narrative:  ?Start time: 10/17/2021 12:25 PM ?End time: 10/17/2021 12:35 PM ?Injection made incrementally with aspirations every 5 mL. ? ?Performed by: Personally  ?Anesthesiologist: Myrtie Soman, MD ? ?Additional Notes: ?Patient tolerated the procedure well without complications ? ? ? ?

## 2021-10-17 NOTE — Anesthesia Postprocedure Evaluation (Signed)
Anesthesia Post Note ? ?Patient: Marcus [name] ? ?Procedure(s) Performed: LEFT ARM ARTERIOVENOUS FISTULA LIGATION (Left: Arm Upper) ? ?  ? ?Patient location during evaluation: PACU ?Anesthesia Type: MAC ?Level of consciousness: awake and alert ?Pain management: pain level controlled ?Vital Signs Assessment: post-procedure vital signs reviewed and stable ?Respiratory status: spontaneous breathing, nonlabored ventilation, respiratory function stable and patient connected to nasal cannula oxygen ?Cardiovascular status: stable and blood pressure returned to baseline ?Postop Assessment: no apparent nausea or vomiting ?Anesthetic complications: no ? ? ?No notable events documented. ? ?Last Vitals:  ?Vitals:  ? 10/17/21 1245 10/17/21 1425  ?BP: (!) 158/84 (!) 176/92  ?Pulse: 67 66  ?Resp: 17 18  ?Temp:  (!) 36.1 ?C  ?SpO2: 95% 98%  ?  ?Last Pain:  ?Vitals:  ? 10/17/21 1108  ?TempSrc:   ?PainSc: 0-No pain  ? ? ?  ?  ?  ?  ?  ?  ? ?Libni Fusaro S ? ? ? ? ?

## 2021-10-17 NOTE — H&P (Signed)
History and Physical Interval Note: ? ?10/17/2021 ?11:30 AM ? ?[name]  has presented today for surgery, with the diagnosis of ESRD.  The various methods of treatment have been discussed with the patient and family. After consideration of risks, benefits and other options for treatment, the patient has consented to  Procedure(s) with comments: ?LEFT ARM ARTERIOVENOUS FISTULA REVISION (Left) - PERIPHERAL NERVE BLOCK as a surgical intervention.  The patient's history has been reviewed, patient examined, no change in status, stable for surgery.  I have reviewed the patient's chart and labs.  Questions were answered to the patient's satisfaction.   ? ?Left arm AVF revision with sidebranch ligation. ? ?Marcus [name] ? ?[facility] [place] ?  ?ASSESSMENT / PLAN: ?66 y.o. male with ESRD dialyzing MWF via RIJ TDC. I created a left brachiocephalic arteriovenous fistula 09/05/2021.  This has been slow to mature.  Duplex today shows a large competing branch in the mid arm.  Plan revision of AV fistula with sidebranch ligation 10/17/2021. ?  ?CHIEF COMPLAINT: ESRD ?  ?HISTORY OF PRESENT ILLNESS: ?Marcus [name] is a 66 y.o. male with new diagnosis of end-stage renal disease.  The patient has had a tunneled dialysis catheter placed.  He has undergone 1 treatment of hemodialysis.  An outpatient plan for dialysis is being created for him.  Patient is right-handed.  He has undergone right rotator cuff surgery.  This was complicated by 2 episodes of what sounds like asystole, per the patient and his family.  He is understandably nervous about undergoing another anesthetic and surgery.  He has never had any other surgery on his upper extremities.  He has never had a pacemaker.  He has never had a central venous catheter prior to his tunneled dialysis catheter. ?  ?10/15/21: Patient returns to clinic doing well.  He feels overall improved after initiating dialysis.  He is  tolerating dialysis reasonably well via right IJ tunneled line on Mondays, Wednesdays, and Fridays.  He is hopeful to transition to using his fistula soon. ?  ?  ?    ?Past Medical History:  ?Diagnosis Date  ? Anemia    ? Arthritis    ? Chronic kidney disease    ? Complication of anesthesia    ?  Stopped breathing during rotator cuff surgery  ? Diabetes mellitus without complication ([place])    ? ESRD (end stage renal disease) ([place])    ?  MWF [employer] started 07/2021  ? Hypertension    ?  ?  ?     ?Past Surgical History:  ?Procedure Laterality Date  ? AV FISTULA PLACEMENT Left 09/05/2021  ?  Procedure: LEFT ARM  RADIOCEPHALIC ARTERIOVENOUS (AV) FISTULA CREATION;  Surgeon: Cherre Robins, MD;  Location: [facility] OR;  Service: Vascular;  Laterality: Left;  PERIPHERAL NERVE BLOCK  ? HERNIA REPAIR      ? IR FLUORO GUIDE CV LINE RIGHT   07/17/2021  ? IR US GUIDE VASC ACCESS RIGHT   07/17/2021  ? ROTATOR CUFF REPAIR Right    ?  ?  ?     ?Family History  ?Problem Relation Age of Onset  ? CAD Mother    ? Hypertension Sister    ? Renal Disease Sister    ?  ?  ?Social History  ?  ?     ?Socioeconomic History  ? Marital status: Married  ?    Spouse name: Not on file  ? Number  of children: Not on file  ? Years of education: Not on file  ? Highest education level: Not on file  ?Occupational History  ? Not on file  ?Tobacco Use  ? Smoking status: Never  ? Smokeless tobacco: Never  ?Vaping Use  ? Vaping Use: Never used  ?Substance and Sexual Activity  ? Alcohol use: Never  ? Drug use: Never  ? Sexual activity: Not on file  ?Other Topics Concern  ? Not on file  ?Social History Narrative  ? Not on file  ?  ?Social Determinants of Health  ?  ?Financial Resource Strain: Not on file  ?Food Insecurity: Not on file  ?Transportation Needs: Not on file  ?Physical Activity: Not on file  ?Stress: Not on file  ?Social Connections: Not on file  ?Intimate Partner Violence: Not on file  ?  ?  ?     ?Allergies  ?Allergen Reactions  ? Amlodipine  Swelling and Other (See Comments)  ?    Swelling of lower limbs  ?  ?  ?      ?Current Outpatient Medications  ?Medication Sig Dispense Refill  ? acetaminophen (TYLENOL) 325 MG tablet Take 650 mg by mouth every 6 (six) hours as needed for moderate pain or headache.      ? albuterol (VENTOLIN HFA) 108 (90 Base) MCG/ACT inhaler Inhale 1-2 puffs into the lungs every 6 (six) hours as needed for wheezing or shortness of breath. (Patient not taking: Reported on 09/02/2021) 8 g 0  ? atorvastatin (LIPITOR) 20 MG tablet Take 10 mg by mouth at bedtime.      ? B Complex-C-Folic Acid (DIALYVITE TABLET) TABS Take 1 tablet by mouth daily.      ? budesonide-formoterol (SYMBICORT) 160-4.5 MCG/ACT inhaler Inhale 2 puffs into the lungs in the morning and at bedtime. (Patient taking differently: Inhale 2 puffs into the lungs daily as needed (Wheezing).) 10.2 g 0  ? carvedilol (COREG) 25 MG tablet Take 25 mg by mouth 2 (two) times daily with a meal.      ? Cholecalciferol 25 MCG (1000 UT) TBDP Take 1,000 Units by mouth daily.      ? diclofenac Sodium (VOLTAREN) 1 % GEL Apply 2 g topically 4 (four) times daily as needed (pain).      ? gabapentin (NEURONTIN) 100 MG capsule Take 100 mg by mouth 2 (two) times daily.      ? hydrALAZINE (APRESOLINE) 25 MG tablet Take 1 tablet (25 mg total) by mouth every 8 (eight) hours. 90 tablet 0  ? HYDROcodone-acetaminophen (NORCO/VICODIN) 5-325 MG tablet Take 1 tablet by mouth every 6 (six) hours as needed. 8 tablet 0  ? losartan (COZAAR) 100 MG tablet Take 100 mg by mouth at bedtime.      ? multivitamin (RENA-VIT) TABS tablet Take 1 tablet by mouth at bedtime. (Patient not taking: Reported on 09/02/2021) 30 tablet 0  ? Nutritional Supplements (FEEDING SUPPLEMENT, NEPRO CARB STEADY,) LIQD Take 237 mLs by mouth daily.   0  ? sevelamer carbonate (RENVELA) 800 MG tablet Take 1 tablet (800 mg total) by mouth 3 (three) times daily with meals. 90 tablet 1  ?  ?No current facility-administered medications for  this visit.  ?  ?  ?PHYSICAL EXAM ?There were no vitals filed for this visit. ?  ?Constitutional: well appearing. no distress. Appears well nourished.  ?Neurologic: CN intact. no focal findings. no sensory loss. ?Psychiatric:  Mood and affect symmetric and appropriate. ?Eyes:  No icterus. No conjunctival  pallor. ?Ears, nose, throat:  mucous membranes moist. Midline trachea.  ?Cardiac: regular rate and rhythm.  ?Respiratory:  unlabored. ?Abdominal:  soft, non-tender, non-distended.  ?Peripheral vascular: 2+ radial pulses ?Extremity: no edema. no cyanosis. No pallor.  ?Skin: no gangrene. no ulceration.  ?Lymphatic: no Stemmer's sign. no palpable lymphadenopathy. ?  ?PERTINENT LABORATORY AND RADIOLOGIC DATA ?  ?Most recent CBC ?  ?  Latest Ref Rng & Units 09/05/2021  ?  6:37 AM 07/20/2021  ? 11:32 AM 07/19/2021  ?  1:18 AM  ?CBC  ?WBC 4.0 - 10.5 K/uL   6.8   6.4    ?Hemoglobin 13.0 - 17.0 g/dL 10.9   8.6   8.1    ?Hematocrit 39.0 - 52.0 % 32.0   26.3   24.6    ?Platelets 150 - 400 K/uL   169   179    ?  ?  ?Most recent CMP ?  ?  Latest Ref Rng & Units 09/05/2021  ?  6:37 AM 07/20/2021  ? 11:32 AM 07/19/2021  ?  1:18 AM  ?CMP  ?Glucose 70 - 99 mg/dL 179   275   154    ?BUN 8 - 23 mg/dL 26   63   92    ?Creatinine 0.61 - 1.24 mg/dL 7.10   11.22   13.44    ?Sodium 135 - 145 mmol/L 140   136   141    ?Potassium 3.5 - 5.1 mmol/L 3.9   3.5   4.3    ?Chloride 98 - 111 mmol/L 100   103   108    ?CO2 22 - 32 mmol/L   23   21    ?Calcium 8.9 - 10.3 mg/dL   7.8   7.2    ?  ?  ?Renal function ?CrCl cannot be calculated (Patient's most recent lab result is older than the maximum 21 days allowed.). ?  ?Last Labs  ?   ?Hgb A1c MFr Bld (%)  ?Date Value  ?07/16/2021 5.0  ?  ?  ?  ?Last Labs  ?No results found for: LDLCALC, LDLC, HIRISKLDL, POCLDL, LDLDIRECT, REALLDLC, TOTLDLC  ?  ?  ?Vascular Imaging: ?Duplex ultrasound of the AV fistula shows low flow rate and large competing branch in the fistula. ?  ?Yevonne Aline. Stanford Breed, MD ?[facility] [place] ?Office Phone Number: [phone] ?10/14/2021 12:23 PM ?  ?Total time spent on preparing this encounter including chart review, data review, collecting history, examining the patient,

## 2021-10-17 NOTE — Discharge Instructions (Signed)
? ?  [facility] [place] ? ?Discharge Instructions ? ?AV Fistula or Graft Surgery for Dialysis Access ? ?Please refer to the following instructions for your post-procedure care. Your surgeon or physician assistant will discuss any changes with you. ? ?Activity ? ?You may drive the day following your surgery, if you are comfortable and no longer taking prescription pain medication. Resume full activity as the soreness in your incision resolves. ? ?Bathing/Showering ? ?You may shower after you go home. Keep your incision dry for 48 hours. Do not soak in a bathtub, hot tub, or swim until the incision heals completely. You may not shower if you have a hemodialysis catheter. ? ?Incision Care ? ?Clean your incision with mild soap and water after 48 hours. Pat the area dry with a clean towel. You do not need a bandage unless otherwise instructed. Do not apply any ointments or creams to your incision. You may have skin glue on your incision. Do not peel it off. It will come off on its own in about one week. Your arm may swell a bit after surgery. To reduce swelling use pillows to elevate your arm so it is above your heart. Your doctor will tell you if you need to lightly wrap your arm with an ACE bandage. ? ?Diet ? ?Resume your normal diet. There are not special food restrictions following this procedure. In order to heal from your surgery, it is CRITICAL to get adequate nutrition. Your body requires vitamins, minerals, and protein. Vegetables are the best source of vitamins and minerals. Vegetables also provide the perfect balance of protein. Processed food has little nutritional value, so try to avoid this. ? ?Medications ? ?Resume taking all of your medications. If your incision is causing pain, you may take over-the counter pain relievers such as acetaminophen (Tylenol). If you were prescribed a stronger pain medication, please be aware these medications can cause nausea and constipation. Prevent  nausea by taking the medication with a snack or meal. Avoid constipation by drinking plenty of fluids and eating foods with high amount of fiber, such as fruits, vegetables, and grains.  ?Do not take Tylenol if you are taking prescription pain medications. ? ?Follow up ?Your surgeon may want to see you in the office following your access surgery. If so, this will be arranged at the time of your surgery. ? ?Please call us immediately for any of the following conditions: ? ?Increased pain, redness, drainage (pus) from your incision site ?Fever of 101 degrees or higher ?Severe or worsening pain at your incision site ?Hand pain or numbness. ? ?Reduce your risk of vascular disease: ? ?Stop smoking. If you would like help, call [facility] at 1-800-QUIT-NOW 805 447 5889) or [place] at [phone] ? ?Manage your cholesterol ?Maintain a desired weight ?Control your diabetes ?Keep your blood pressure down ? ?Dialysis ? ?It will take several weeks to several months for your new dialysis access to be ready for use. Your surgeon will determine when it is okay to use it. Your nephrologist will continue to direct your dialysis. You can continue to use your Permcath until your new access is ready for use. ? ? ?10/17/2021 ?[place] ?[id number] ?21-Apr-1956 ? ?Surgeon(s): ?Cherre Robins, MD ? ?Procedure(s): ?Ligation competing branches left arm fistula ? ?x Do not stick fistula for 12 weeks  ? ? ?If you have any questions, please call the office at [phone]. ? ?

## 2021-10-18 ENCOUNTER — Encounter (HOSPITAL_COMMUNITY): Payer: Self-pay | Admitting: Vascular Surgery

## 2021-11-19 ENCOUNTER — Other Ambulatory Visit: Payer: Self-pay | Admitting: *Deleted

## 2021-11-19 DIAGNOSIS — N186 End stage renal disease: Secondary | ICD-10-CM

## 2021-11-26 ENCOUNTER — Ambulatory Visit (INDEPENDENT_AMBULATORY_CARE_PROVIDER_SITE_OTHER): Payer: No Typology Code available for payment source | Admitting: Physician Assistant

## 2021-11-26 ENCOUNTER — Ambulatory Visit (HOSPITAL_COMMUNITY)
Admission: RE | Admit: 2021-11-26 | Discharge: 2021-11-26 | Disposition: A | Discharge status: Home / Self Care | Payer: No Typology Code available for payment source | Source: Ambulatory Visit | Attending: Vascular Surgery | Admitting: Vascular Surgery

## 2021-11-26 VITALS — BP 173/78 | HR 65 | Temp 98.0°F | Resp 18 | Ht 66.5 in | Wt 260.0 lb

## 2021-11-26 DIAGNOSIS — Z992 Dependence on renal dialysis: Secondary | ICD-10-CM

## 2021-11-26 DIAGNOSIS — N186 End stage renal disease: Secondary | ICD-10-CM | POA: Insufficient documentation

## 2021-11-26 NOTE — Progress Notes (Signed)
    Postoperative Access Visit   History of Present Illness   Marcus [name] is a 66 y.o. year old male who presents for postoperative follow-up for sidebranch ligation of left brachiocephalic fistula by Dr. Stanford Breed.  Patient states the incisions are healed.  He denies steal syndrome.  He is dialyzing via right IJ [facility] on a Monday Wednesday Friday schedule at the [facility] location.    Physical Examination   Vitals:   11/26/21 1223  BP: (!) 173/78  Pulse: 65  Resp: 18  Temp: 98 F (36.7 C)  TempSrc: Temporal  SpO2: 98%  Weight: 260 lb (117.9 kg)  Height: 5' 6.5" (1.689 m)   Body mass index is 41.34 kg/m.  left arm Incisions are healed, palpable radial pulse, hand grip is 5/5, sensation in digits is intact, palpable thrill, bruit can be auscultated     Medical Decision Making   Marcus [name] is a 66 y.o. year old male who presents s/p left  brachiocephalic fistula sidebranch ligation  Patent L arm fistula without signs or symptoms of steal syndrome On duplex and physical exam patient's fistula runs borderline to deep from the skin surface.  If dialysis has trouble cannulating fistula, he will require superficialization procedure.  The patient and his wife are aware of this. The patient's access will be ready for use Monday 12/02/21 The patient's tunneled dialysis catheter can be removed when Nephrology is comfortable with the performance of the fistula The patient may follow up on a prn basis   Dagoberto Ligas PA-C [facility] [place] Office: [facility] MD: Stanford Breed

## 2021-11-26 NOTE — H&P (View-Only) (Signed)
    Postoperative Access Visit   History of Present Illness   Marcus [name] is a 66 y.o. year old male who presents for postoperative follow-up for sidebranch ligation of left brachiocephalic fistula by Dr. Stanford Breed.  Patient states the incisions are healed.  He denies steal syndrome.  He is dialyzing via right IJ [facility] on a Monday Wednesday Friday schedule at the [facility] location.    Physical Examination   Vitals:   11/26/21 1223  BP: (!) 173/78  Pulse: 65  Resp: 18  Temp: 98 F (36.7 C)  TempSrc: Temporal  SpO2: 98%  Weight: 260 lb (117.9 kg)  Height: 5' 6.5" (1.689 m)   Body mass index is 41.34 kg/m.  left arm Incisions are healed, palpable radial pulse, hand grip is 5/5, sensation in digits is intact, palpable thrill, bruit can be auscultated     Medical Decision Making   Marcus [name] is a 66 y.o. year old male who presents s/p left  brachiocephalic fistula sidebranch ligation  Patent L arm fistula without signs or symptoms of steal syndrome On duplex and physical exam patient's fistula runs borderline to deep from the skin surface.  If dialysis has trouble cannulating fistula, he will require superficialization procedure.  The patient and his wife are aware of this. The patient's access will be ready for use Monday 12/02/21 The patient's tunneled dialysis catheter can be removed when Nephrology is comfortable with the performance of the fistula The patient may follow up on a prn basis   Dagoberto Ligas PA-C [facility] [place] Office: [facility] MD: Stanford Breed

## 2021-12-18 ENCOUNTER — Other Ambulatory Visit: Payer: Self-pay

## 2021-12-18 ENCOUNTER — Encounter (HOSPITAL_COMMUNITY): Payer: Self-pay | Admitting: Vascular Surgery

## 2021-12-18 DIAGNOSIS — N186 End stage renal disease: Secondary | ICD-10-CM

## 2021-12-18 NOTE — Anesthesia Preprocedure Evaluation (Signed)
Anesthesia Evaluation  Patient identified by MRN, date of birth, ID band Patient awake    Reviewed: Allergy & Precautions, Patient's Chart, lab work & pertinent test results, Unable to perform ROS - Chart review only  Airway Mallampati: II  TM Distance: >3 FB Neck ROM: Full    Dental no notable dental hx.    Pulmonary neg pulmonary ROS,    Pulmonary exam normal breath sounds clear to auscultation       Cardiovascular hypertension, Normal cardiovascular exam Rhythm:Regular Rate:Normal     Neuro/Psych negative neurological ROS  negative psych ROS   GI/Hepatic negative GI ROS, Neg liver ROS,   Endo/Other  diabetes, Type 2Morbid obesity  Renal/GU DialysisRenal disease  negative genitourinary   Musculoskeletal negative musculoskeletal ROS (+)   Abdominal   Peds negative pediatric ROS (+)  Hematology negative hematology ROS (+)   Anesthesia Other Findings   Reproductive/Obstetrics negative OB ROS                             Anesthesia Physical  Anesthesia Plan  ASA: 3  Anesthesia Plan: Regional   Post-op Pain Management: Regional block* and Tylenol PO (pre-op)*   Induction: Intravenous  PONV Risk Score and Plan: 1 and Propofol infusion, Treatment may vary due to age or medical condition, Midazolam and Ondansetron  Airway Management Planned: Simple Face Mask  Additional Equipment:   Intra-op Plan:   Post-operative Plan:   Informed Consent:   Plan Discussed with:   Anesthesia Plan Comments:         Anesthesia Quick Evaluation

## 2021-12-18 NOTE — Progress Notes (Signed)
PCP - [facility]  Cardiologist - n/a  Chest x-ray - 07/16/21 (2V) EKG - 07/16/21 Stress Test - n/a ECHO - 07/17/21 Cardiac Cath - n/a  ICD Pacemaker/Loop - n/a  Dialysis M-W-F, Last dialysis was on Wed., 12/18/21.  Sleep Study -  Yes CPAP - does not use CPAP  Diabetes Type 2, no meds.   If your blood sugar is less than 70 mg/dL, you will need to treat for low blood sugar: Treat a low blood sugar (less than 70 mg/dL) with  cup of clear juice (cranberry or apple), 4 glucose tablets, OR glucose gel. Recheck blood sugar in 15 minutes after treatment (to make sure it is greater than 70 mg/dL). If your blood sugar is not greater than 70 mg/dL on recheck, call [phone] for further instructions.  Anesthesia review: Yes  STOP now taking any Aspirin (unless otherwise instructed by your surgeon), Aleve, Naproxen, Ibuprofen, Motrin, Advil, Goody's, BC's, all herbal medications, fish oil, and all vitamins.   Coronavirus Screening Do you have any of the following symptoms:  Cough yes/no: No Fever (>100.27F)  yes/no: No Runny nose yes/no: No Sore throat yes/no: No Difficulty breathing/shortness of breath  yes/no: No  Have you traveled in the last 14 days and where? yes/no: No  Patient's wife Marcus [name] verbalized understanding of instructions that were given via phone.

## 2021-12-19 ENCOUNTER — Ambulatory Visit (HOSPITAL_COMMUNITY): Payer: No Typology Code available for payment source | Admitting: Physician Assistant

## 2021-12-19 ENCOUNTER — Encounter (HOSPITAL_COMMUNITY): Payer: Self-pay | Admitting: Vascular Surgery

## 2021-12-19 ENCOUNTER — Ambulatory Visit (HOSPITAL_COMMUNITY)
Admission: RE | Admit: 2021-12-19 | Discharge: 2021-12-19 | Disposition: A | Discharge status: Home / Self Care | Payer: No Typology Code available for payment source | Attending: Vascular Surgery | Admitting: Vascular Surgery

## 2021-12-19 ENCOUNTER — Other Ambulatory Visit: Payer: Self-pay

## 2021-12-19 ENCOUNTER — Ambulatory Visit (HOSPITAL_BASED_OUTPATIENT_CLINIC_OR_DEPARTMENT_OTHER): Payer: No Typology Code available for payment source | Admitting: Physician Assistant

## 2021-12-19 ENCOUNTER — Encounter (HOSPITAL_COMMUNITY)
Admission: RE | Disposition: A | Discharge status: Home / Self Care | Payer: Self-pay | Source: Home / Self Care | Attending: Vascular Surgery

## 2021-12-19 DIAGNOSIS — N186 End stage renal disease: Secondary | ICD-10-CM

## 2021-12-19 DIAGNOSIS — G4730 Sleep apnea, unspecified: Secondary | ICD-10-CM | POA: Diagnosis not present

## 2021-12-19 DIAGNOSIS — Z6841 Body Mass Index (BMI) 40.0 and over, adult: Secondary | ICD-10-CM | POA: Insufficient documentation

## 2021-12-19 DIAGNOSIS — Z992 Dependence on renal dialysis: Secondary | ICD-10-CM | POA: Diagnosis not present

## 2021-12-19 DIAGNOSIS — E1122 Type 2 diabetes mellitus with diabetic chronic kidney disease: Secondary | ICD-10-CM | POA: Insufficient documentation

## 2021-12-19 DIAGNOSIS — I120 Hypertensive chronic kidney disease with stage 5 chronic kidney disease or end stage renal disease: Secondary | ICD-10-CM

## 2021-12-19 HISTORY — PX: AV FISTULA PLACEMENT: SHX1204

## 2021-12-19 HISTORY — PX: FISTULA SUPERFICIALIZATION: SHX6341

## 2021-12-19 LAB — POCT I-STAT, CHEM 8
BUN: 27 mg/dL — ABNORMAL HIGH (ref 8–23)
Calcium, Ion: 1.15 mmol/L (ref 1.15–1.40)
Chloride: 100 mmol/L (ref 98–111)
Creatinine, Ser: 8.3 mg/dL — ABNORMAL HIGH (ref 0.61–1.24)
Glucose, Bld: 200 mg/dL — ABNORMAL HIGH (ref 70–99)
HCT: 32 % — ABNORMAL LOW (ref 39.0–52.0)
Hemoglobin: 10.9 g/dL — ABNORMAL LOW (ref 13.0–17.0)
Potassium: 4.1 mmol/L (ref 3.5–5.1)
Sodium: 138 mmol/L (ref 135–145)
TCO2: 26 mmol/L (ref 22–32)

## 2021-12-19 LAB — GLUCOSE, CAPILLARY
Glucose-Capillary: 225 mg/dL — ABNORMAL HIGH (ref 70–99)
Glucose-Capillary: 140 mg/dL — ABNORMAL HIGH (ref 70–99)

## 2021-12-19 SURGERY — FISTULA SUPERFICIALIZATION
Anesthesia: Regional | Site: Arm Upper | Laterality: Left

## 2021-12-19 MED ORDER — PROPOFOL 500 MG/50ML IV EMUL
INTRAVENOUS | Status: DC | PRN
  Administered 2021-12-19: 75 ug/kg/min via INTRAVENOUS

## 2021-12-19 MED ORDER — ONDANSETRON HCL 4 MG/2ML IJ SOLN
INTRAMUSCULAR | Status: DC | PRN
  Administered 2021-12-19: 4 mg via INTRAVENOUS

## 2021-12-19 MED ORDER — INSULIN ASPART 100 UNIT/ML IJ SOLN
INTRAMUSCULAR | Status: AC
  Filled 2021-12-19: qty 1

## 2021-12-19 MED ORDER — INSULIN ASPART 100 UNIT/ML IJ SOLN
0.0000 [IU] | INTRAMUSCULAR | Status: DC | PRN
  Administered 2021-12-19: 3 [IU] via SUBCUTANEOUS

## 2021-12-19 MED ORDER — PROMETHAZINE HCL 25 MG/ML IJ SOLN: 6.2500 mg | INTRAMUSCULAR | Status: DC | PRN

## 2021-12-19 MED ORDER — SODIUM CHLORIDE 0.9 % IV SOLN: INTRAVENOUS | Status: DC

## 2021-12-19 MED ORDER — CEFAZOLIN SODIUM-DEXTROSE 2-4 GM/100ML-% IV SOLN
2.0000 g | INTRAVENOUS | Status: AC
  Administered 2021-12-19: 2 g via INTRAVENOUS
  Filled 2021-12-19: qty 100

## 2021-12-19 MED ORDER — FENTANYL CITRATE (PF) 100 MCG/2ML IJ SOLN: 50.0000 ug | Freq: Once | INTRAMUSCULAR | Status: AC

## 2021-12-19 MED ORDER — LIDOCAINE-EPINEPHRINE (PF) 1.5 %-1:200000 IJ SOLN
INTRAMUSCULAR | Status: DC | PRN
  Administered 2021-12-19: 10 mL via PERINEURAL

## 2021-12-19 MED ORDER — ACETAMINOPHEN 500 MG PO TABS
1000.0000 mg | ORAL_TABLET | Freq: Once | ORAL | Status: AC
  Administered 2021-12-19: 1000 mg via ORAL
  Filled 2021-12-19: qty 2

## 2021-12-19 MED ORDER — MEPERIDINE HCL 25 MG/ML IJ SOLN: 6.2500 mg | INTRAMUSCULAR | Status: DC | PRN

## 2021-12-19 MED ORDER — ORAL CARE MOUTH RINSE: 15.0000 mL | Freq: Once | OROMUCOSAL | Status: AC

## 2021-12-19 MED ORDER — CHLORHEXIDINE GLUCONATE 0.12 % MT SOLN
15.0000 mL | Freq: Once | OROMUCOSAL | Status: AC
Start: 2021-12-19 — End: 2021-12-19
  Administered 2021-12-19: 15 mL via OROMUCOSAL
  Filled 2021-12-19: qty 15

## 2021-12-19 MED ORDER — HEPARIN 6000 UNIT IRRIGATION SOLUTION
Status: DC | PRN
  Administered 2021-12-19: 1

## 2021-12-19 MED ORDER — MIDAZOLAM HCL 2 MG/2ML IJ SOLN
1.0000 mg | Freq: Once | INTRAMUSCULAR | Status: AC
Start: 2021-12-19 — End: 2021-12-19

## 2021-12-19 MED ORDER — LIDOCAINE HCL (PF) 1.5 % IJ SOLN
INTRAMUSCULAR | Status: DC | PRN
  Administered 2021-12-19: 20 mL via PERINEURAL

## 2021-12-19 MED ORDER — FENTANYL CITRATE (PF) 250 MCG/5ML IJ SOLN
INTRAMUSCULAR | Status: DC | PRN
  Administered 2021-12-19: 25 ug via INTRAVENOUS

## 2021-12-19 MED ORDER — OXYCODONE HCL 5 MG/5ML PO SOLN: 5.0000 mg | Freq: Once | ORAL | Status: DC | PRN

## 2021-12-19 MED ORDER — ONDANSETRON HCL 4 MG/2ML IJ SOLN
INTRAMUSCULAR | Status: AC
  Filled 2021-12-19: qty 2

## 2021-12-19 MED ORDER — 0.9 % SODIUM CHLORIDE (POUR BTL) OPTIME
TOPICAL | Status: DC | PRN
  Administered 2021-12-19: 1000 mL

## 2021-12-19 MED ORDER — PHENYLEPHRINE HCL-NACL 20-0.9 MG/250ML-% IV SOLN
INTRAVENOUS | Status: DC | PRN
  Administered 2021-12-19: 30 ug/min via INTRAVENOUS

## 2021-12-19 MED ORDER — OXYCODONE HCL 5 MG PO TABS: 5.0000 mg | ORAL_TABLET | Freq: Once | ORAL | Status: DC | PRN

## 2021-12-19 MED ORDER — LIDOCAINE-EPINEPHRINE (PF) 1 %-1:200000 IJ SOLN
INTRAMUSCULAR | Status: AC
  Filled 2021-12-19: qty 30

## 2021-12-19 MED ORDER — HEPARIN 6000 UNIT IRRIGATION SOLUTION
Status: AC
  Filled 2021-12-19: qty 500

## 2021-12-19 MED ORDER — CHLORHEXIDINE GLUCONATE 4 % EX LIQD: 60.0000 mL | Freq: Once | CUTANEOUS | Status: DC

## 2021-12-19 MED ORDER — FENTANYL CITRATE (PF) 250 MCG/5ML IJ SOLN
INTRAMUSCULAR | Status: AC
  Filled 2021-12-19: qty 5

## 2021-12-19 MED ORDER — MIDAZOLAM HCL 2 MG/2ML IJ SOLN
INTRAMUSCULAR | Status: AC
  Filled 2021-12-19: qty 2

## 2021-12-19 MED ORDER — PHENYLEPHRINE 80 MCG/ML (10ML) SYRINGE FOR IV PUSH (FOR BLOOD PRESSURE SUPPORT)
PREFILLED_SYRINGE | INTRAVENOUS | Status: DC | PRN
  Administered 2021-12-19 (×2): 80 ug via INTRAVENOUS
  Administered 2021-12-19 (×2): 160 ug via INTRAVENOUS
  Administered 2021-12-19: 80 ug via INTRAVENOUS

## 2021-12-19 MED ORDER — FENTANYL CITRATE (PF) 100 MCG/2ML IJ SOLN
INTRAMUSCULAR | Status: AC
  Administered 2021-12-19: 50 ug
  Filled 2021-12-19: qty 2

## 2021-12-19 MED ORDER — MIDAZOLAM HCL 2 MG/2ML IJ SOLN
INTRAMUSCULAR | Status: AC
  Administered 2021-12-19: 1 mg via INTRAVENOUS
  Filled 2021-12-19: qty 2

## 2021-12-19 MED ORDER — FENTANYL CITRATE (PF) 100 MCG/2ML IJ SOLN: 25.0000 ug | INTRAMUSCULAR | Status: DC | PRN

## 2021-12-19 SURGICAL SUPPLY — 35 items
ARMBAND PINK RESTRICT EXTREMIT (MISCELLANEOUS) ×3 IMPLANT
BAG COUNTER SPONGE SURGICOUNT (BAG) ×3 IMPLANT
BENZOIN TINCTURE PRP APPL 2/3 (GAUZE/BANDAGES/DRESSINGS) ×3 IMPLANT
CANISTER SUCT 3000ML PPV (MISCELLANEOUS) ×3 IMPLANT
CANNULA VESSEL 3MM 2 BLNT TIP (CANNULA) ×3 IMPLANT
CHLORAPREP W/TINT 26 (MISCELLANEOUS) ×3 IMPLANT
COVER PROBE W GEL 5X96 (DRAPES) ×1 IMPLANT
DERMABOND ADVANCED (GAUZE/BANDAGES/DRESSINGS) ×1
DERMABOND ADVANCED .7 DNX12 (GAUZE/BANDAGES/DRESSINGS) IMPLANT
ELECT REM PT RETURN 9FT ADLT (ELECTROSURGICAL) ×3
ELECTRODE REM PT RTRN 9FT ADLT (ELECTROSURGICAL) ×2 IMPLANT
GLOVE BIO SURGEON STRL SZ7.5 (GLOVE) ×1 IMPLANT
GLOVE BIO SURGEON STRL SZ8 (GLOVE) ×1 IMPLANT
GLOVE SURG SS PI 6.5 STRL IVOR (GLOVE) ×1 IMPLANT
GOWN STRL REUS W/ TWL LRG LVL3 (GOWN DISPOSABLE) ×4 IMPLANT
GOWN STRL REUS W/ TWL XL LVL3 (GOWN DISPOSABLE) ×2 IMPLANT
GOWN STRL REUS W/TWL LRG LVL3 (GOWN DISPOSABLE) ×2
GOWN STRL REUS W/TWL XL LVL3 (GOWN DISPOSABLE) ×2
GRAFT GORETEX STRT 4-7X45 (Vascular Products) ×1 IMPLANT
INSERT FOGARTY SM (MISCELLANEOUS) ×1 IMPLANT
KIT BASIN OR (CUSTOM PROCEDURE TRAY) ×3 IMPLANT
KIT TURNOVER KIT B (KITS) ×3 IMPLANT
NS IRRIG 1000ML POUR BTL (IV SOLUTION) ×3 IMPLANT
PACK CV ACCESS (CUSTOM PROCEDURE TRAY) ×3 IMPLANT
PAD ARMBOARD 7.5X6 YLW CONV (MISCELLANEOUS) ×6 IMPLANT
PENCIL SMOKE EVACUATOR (MISCELLANEOUS) ×3 IMPLANT
SLING ARM FOAM STRAP LRG (SOFTGOODS) IMPLANT
SLING ARM FOAM STRAP MED (SOFTGOODS) IMPLANT
SUT MNCRL AB 4-0 PS2 18 (SUTURE) ×4 IMPLANT
SUT PROLENE 6 0 BV (SUTURE) ×5 IMPLANT
SUT VIC AB 3-0 SH 27 (SUTURE) ×2
SUT VIC AB 3-0 SH 27X BRD (SUTURE) ×2 IMPLANT
TOWEL GREEN STERILE (TOWEL DISPOSABLE) ×3 IMPLANT
UNDERPAD 30X36 HEAVY ABSORB (UNDERPADS AND DIAPERS) ×3 IMPLANT
WATER STERILE IRR 1000ML POUR (IV SOLUTION) ×3 IMPLANT

## 2021-12-19 NOTE — Discharge Instructions (Signed)
° °  [facility] [place] ° °Discharge Instructions ° °AV Fistula or Graft Surgery for Dialysis Access ° °Please refer to the following instructions for your post-procedure care. Your surgeon or physician assistant will discuss any changes with you. ° °Activity ° °You may drive the day following your surgery, if you are comfortable and no longer taking prescription pain medication. Resume full activity as the soreness in your incision resolves. ° °Bathing/Showering ° °You may shower after you go home. Keep your incision dry for 48 hours. Do not soak in a bathtub, hot tub, or swim until the incision heals completely. You may not shower if you have a hemodialysis catheter. ° °Incision Care ° °Clean your incision with mild soap and water after 48 hours. Pat the area dry with a clean towel. You do not need a bandage unless otherwise instructed. Do not apply any ointments or creams to your incision. You may have skin glue on your incision. Do not peel it off. It will come off on its own in about one week. Your arm may swell a bit after surgery. To reduce swelling use pillows to elevate your arm so it is above your heart. Your doctor will tell you if you need to lightly wrap your arm with an ACE bandage. ° °Diet ° °Resume your normal diet. There are not special food restrictions following this procedure. In order to heal from your surgery, it is CRITICAL to get adequate nutrition. Your body requires vitamins, minerals, and protein. Vegetables are the best source of vitamins and minerals. Vegetables also provide the perfect balance of protein. Processed food has little nutritional value, so try to avoid this. ° °Medications ° °Resume taking all of your medications. If your incision is causing pain, you may take over-the counter pain relievers such as acetaminophen (Tylenol). If you were prescribed a stronger pain medication, please be aware these medications can cause nausea and constipation. Prevent  nausea by taking the medication with a snack or meal. Avoid constipation by drinking plenty of fluids and eating foods with high amount of fiber, such as fruits, vegetables, and grains. Do not take Tylenol if you are taking prescription pain medications. ° ° ° ° °Follow up °Your surgeon may want to see you in the office following your access surgery. If so, this will be arranged at the time of your surgery. ° °Please call us immediately for any of the following conditions: ° °Increased pain, redness, drainage (pus) from your incision site °Fever of 101 degrees or higher °Severe or worsening pain at your incision site °Hand pain or numbness. ° °Reduce your risk of vascular disease: ° °Stop smoking. If you would like help, call [facility] at 1-800-QUIT-NOW (1-800-784-8669) or [place] at [phone] ° °Manage your cholesterol °Maintain a desired weight °Control your diabetes °Keep your blood pressure down ° °Dialysis ° °It will take several weeks to several months for your new dialysis access to be ready for use. Your surgeon will determine when it is OK to use it. Your nephrologist will continue to direct your dialysis. You can continue to use your Permcath until your new access is ready for use. ° °If you have any questions, please call the office at [phone]. ° °

## 2021-12-19 NOTE — Interval H&P Note (Signed)
History and Physical Interval Note:  12/19/2021 12:37 PM  [place]  has presented today for surgery, with the diagnosis of End Stage Renal Disease.  The various methods of treatment have been discussed with the patient and family. After consideration of risks, benefits and other options for treatment, the patient has consented to  Procedure(s): LEFT ARM ARTERIOVENOUS FISTULA SUPERFICIALIZATION (Left) as a surgical intervention.  The patient's history has been reviewed, patient examined, no change in status, stable for surgery.  I have reviewed the patient's chart and labs.  Questions were answered to the patient's satisfaction.     Cherre Robins

## 2021-12-19 NOTE — Progress Notes (Signed)
Orthopedic Tech Progress Note Patient Details:  Cap Massi 08/17/1955 [id number]  Ortho Devices Type of Ortho Device: Arm sling Ortho Device/Splint Interventions: Ordered      Linus Salmons Virgilia Quigg 12/19/2021, 2:43 PM

## 2021-12-19 NOTE — Anesthesia Procedure Notes (Signed)
Procedure Name: MAC Date/Time: 12/19/2021 12:41 PM  Performed by: Leonor Liv, CRNAPre-anesthesia Checklist: Patient identified, Suction available, Emergency Drugs available, Patient being monitored and Timeout performed Patient Re-evaluated:Patient Re-evaluated prior to induction Oxygen Delivery Method: Simple face mask Preoxygenation: Pre-oxygenation with 100% oxygen Placement Confirmation: positive ETCO2 Dental Injury: Teeth and Oropharynx as per pre-operative assessment

## 2021-12-19 NOTE — Transfer of Care (Signed)
Immediate Anesthesia Transfer of Care Note  Patient: [place]  Procedure(s) Performed: LEFT ARM ARTERIOVENOUS FISTULA SUPERFICIALIZATION (Left) INSERTION OF LEFT UPPER ARM ARTERIOVENOUS (AV) GORE-TEX GRAFT (Left: Arm Upper)  Patient Location: PACU  Anesthesia Type:MAC combined with regional for post-op pain  Level of Consciousness: awake, alert  and patient cooperative  Airway & Oxygen Therapy: Patient Spontanous Breathing  Post-op Assessment: Report given to RN and Post -op Vital signs reviewed and stable  Post vital signs: Reviewed and stable  Last Vitals:  Vitals Value Taken Time  BP 148/87 12/19/21 1421  Temp    Pulse 65 12/19/21 1423  Resp 10 12/19/21 1423  SpO2 96 % 12/19/21 1423  Vitals shown include unvalidated device data.  Last Pain:  Vitals:   12/19/21 1050  TempSrc:   PainSc: 0-No pain      Patients Stated Pain Goal: 0 (35/82/51 8984)  Complications: No notable events documented.

## 2021-12-19 NOTE — Op Note (Addendum)
DATE OF SERVICE: 12/19/2021  PATIENT:  Marcus [name]  66 y.o. male  PRE-OPERATIVE DIAGNOSIS:  ESRD in need of HD access  POST-OPERATIVE DIAGNOSIS:  Same  PROCEDURE:   Revision of left brachiocephalic arteriovenous fistula with conversion to brachiocephalic graft (27m PTFE)  SURGEON:  Surgeon(s) and Role:    * HCherre Robins MD - Primary  ASSISTANT: MArlee MuslimPA-C  An experienced assistant was required given the complexity of this procedure and the standard of surgical care. My assistant helped with exposure through counter tension, suctioning, ligation and retraction to better visualize the surgical field.  My assistant expedited sewing during the case by following my sutures. Wherever I use the term "we" in the report, my assistant actively helped me with that portion of the procedure.  ANESTHESIA:   regional and MAC  EBL: minimal  BLOOD ADMINISTERED:none  DRAINS: none   LOCAL MEDICATIONS USED:  NONE  SPECIMEN:  none  COUNTS: confirmed correct.  TOURNIQUET:  none  PATIENT DISPOSITION:  PACU - hemodynamically stable.   Delay start of Pharmacological VTE agent (>24hrs) due to surgical blood loss or risk of bleeding: no  INDICATION FOR PROCEDURE: Marcus Archie MReza Crymesis a 66y.o. male with ESRD in need of permanent dialysis access. I previously created a left brachiocephalic arteriovenous fistula for him 09/05/21. This has not matured well despite sidebranch ligation 10/17/21. After careful discussion of risks, benefits, and alternatives the patient was offered revision of the fistula. The patient understood and wished to proceed.  OPERATIVE FINDINGS: fistula destroyed in the midsection with multiple puncture sites and surrounding hematoma. A sclerotic segment was seen more proximal to this. I did not feel the fistula was salvageable and converted it to a graft. There was a doppler bruit at completion.  DESCRIPTION OF PROCEDURE: After identification  of the patient in the pre-operative holding area, the patient was transferred to the operating room. The patient was positioned supine on the operating room table. Anesthesia was induced. The left arm was prepped and draped in standard fashion. A surgical pause was performed confirming correct patient, procedure, and operative location.  Previous incision at the antecubital fossa was opened sharply.  The fistula anastomosis was skeletonized sharply.  I carried exposure cranially on the brachiocephalic fistula.  Using intraoperative ultrasound the course of the brachiocephalic fistula was mapped on the upper arm.  Skip incisions were made over the upper arm to expose the brachiocephalic fistula.  In the mid upper arm the sclerotic segment was identified.  In the upper arm segment, recently accessed fistula had multiple lacerations and a surrounding hematoma.  This area fistula was no longer usable.  I did not feel like superficial lysing this marginal fistula would be worth the effort.  I brought a 4 to 7 mm tapered graft onto the field.  The fistula was clamped proximally distally.  The graft was tunneled in the upper arm immediately lateral to the course of the cephalic vein.  The intervening segment of cephalic vein was excised so that it did not develop thrombophlebitis.  The proximal anastomosis was performed and 2 and to a transected fistula using continuous running suture of 6-0 Prolene.  Anastomosis was flushed down the open end of the graft.  Distally the distal end of the graft was cut to lay without undue tension or redundancy.  The cut end of the graft was then anastomosed to a spatulated cephalic vein using continuous running suture of 6-0 Prolene.  Immediately prior to  completion the graft was flushed and de-aired.  Clamps were released on the fistula and cephalic vein.  The distal cephalic vein was interrogated with Doppler machine.  Good Doppler bruit was heard.  Hemostasis was achieved in  the anastomosis.  Hemostasis was achieved in the surgical beds.  The wounds were closed in layers using 3-0 Vicryl, 4-0 Monocryl, Dermabond.  Upon completion of the case instrument and sharps counts were confirmed correct. The patient was transferred to the PACU in good condition. I was present for all portions of the procedure.  Yevonne Aline. Stanford Breed, MD [facility] Phone Number: [phone] 12/19/2021 2:13 PM

## 2021-12-19 NOTE — Anesthesia Procedure Notes (Signed)
Anesthesia Regional Block: Supraclavicular block   Pre-Anesthetic Checklist: , timeout performed,  Correct Patient, Correct Site, Correct Laterality,  Correct Procedure, Correct Position, site marked,  Risks and benefits discussed,  Surgical consent,  Pre-op evaluation,  At surgeon's request and post-op pain management  Laterality: Upper and Left  Prep: chloraprep       Needles:  Injection technique: Single-shot  Needle Type: Stimiplex          Additional Needles:   Procedures:,,,, ultrasound used (permanent image in chart),,    Narrative:  Start time: 12/19/2021 11:28 AM End time: 12/19/2021 11:48 AM Injection made incrementally with aspirations every 5 mL.  Performed by: Personally  Anesthesiologist: Nolon Nations, MD  Additional Notes: BP cuff, SpO2 and EKG monitors applied. Sedation begun. Nerve location verified with ultrasound. Anesthetic injected incrementally, slowly, and after neg aspirations under direct u/s guidance. Good perineural spread. Tolerated well.

## 2021-12-20 ENCOUNTER — Other Ambulatory Visit: Payer: Self-pay | Admitting: Surgery

## 2021-12-20 MED ORDER — OXYCODONE HCL 5 MG PO TABS: 5.0000 mg | ORAL_TABLET | Freq: Three times a day (TID) | ORAL | 0 refills | Status: AC | PRN

## 2021-12-20 NOTE — Anesthesia Postprocedure Evaluation (Signed)
Anesthesia Post Note  Patient: Marcus [name]  Procedure(s) Performed: LEFT ARM ARTERIOVENOUS FISTULA SUPERFICIALIZATION (Left) INSERTION OF LEFT UPPER ARM ARTERIOVENOUS (AV) GORE-TEX GRAFT (Left: Arm Upper)     Patient location during evaluation: PACU Anesthesia Type: Regional Level of consciousness: awake and alert Pain management: pain level controlled Vital Signs Assessment: post-procedure vital signs reviewed and stable Respiratory status: spontaneous breathing Cardiovascular status: stable Anesthetic complications: no   No notable events documented.  Last Vitals:  Vitals:   12/19/21 1435 12/19/21 1450  BP: (!) 163/97 (!) 155/92  Pulse: 68 67  Resp: (!) 22 (!) 21  Temp:  (!) 36.3 C  SpO2: 94% 94%    Last Pain:  Vitals:   12/19/21 1450  TempSrc:   PainSc: 0-No pain                 Nolon Nations

## 2021-12-20 NOTE — Progress Notes (Signed)
Patient called and hydrocodone not working I called him in oxycodone 5 mg, 15 tabs.  I told him to come to the office if he is still having problems on Monday   Annamarie Major

## 2021-12-23 ENCOUNTER — Telehealth: Payer: Self-pay

## 2021-12-23 ENCOUNTER — Encounter (HOSPITAL_COMMUNITY): Payer: Self-pay | Admitting: Vascular Surgery

## 2021-12-23 NOTE — Telephone Encounter (Addendum)
Pt's wife, Zigmund Daniel, called stating that she heard a couple of different things about the pt's procedure and wasn't sure exactly what was done on 7/13.  Spoke with PA, reviewed pt's chart, returned call, no answer, vm full.  Pt's wife returned call. Two identifiers used. Clarified with pt what Dr Stanford Breed did and the reasons why. Confirmed understanding. She also wanted a referral from this office to [facility] to get on the transplant list for kidney. She had been told by his kidney center that the referral would have to come from a doctor. Told her that would be forwarded to be addressed. Confirmed understanding.

## 2022-01-09 NOTE — Progress Notes (Unsigned)
    Postoperative Access Visit   History of Present Illness   Marcus [name] is a 66 y.o. year old male who presents for postoperative follow-up for:  Revision of left brachiocephalic arteriovenous fistula with conversion to brachiocephalic graft (68mm PTFE) on 12/19/21 by Dr. Stanford Breed. The patient's wounds are *** healed.  The patient notes *** steal symptoms.  The patient is *** able to complete their activities of daily living.  The patient's current symptoms are: ***.  He is dialyzing via right IJ [facility] on a Monday Wednesday Friday schedule at the [facility] location.   Physical Examination  There were no vitals filed for this visit. There is no height or weight on file to calculate BMI.  left arm Incision is *** healed, *** radial pulse, hand grip is ***/5, sensation in digits is *** intact, ***palpable thrill, bruit can *** be auscultated     Medical Decision Making   Fisk is a 66 y.o. year old male who presents s/p Revision of left brachiocephalic arteriovenous fistula with conversion to brachiocephalic graft (61mm PTFE) on 12/19/21 by Dr. Stanford Breed.   Patent *** without signs or symptoms of steal syndrome The patient's access will be ready for use 01/17/22 ***The patient's tunneled dialysis catheter can be removed when Nephrology is comfortable with the performance of the *** The patient may follow up on a prn basis   Karoline Caldwell, PA-C [facility] [place] Office: [facility] MD: Roxanne Mins

## 2022-01-14 ENCOUNTER — Ambulatory Visit: Payer: No Typology Code available for payment source

## 2022-01-14 DIAGNOSIS — N186 End stage renal disease: Secondary | ICD-10-CM

## 2022-01-14 NOTE — Progress Notes (Signed)
POST OPERATIVE OFFICE NOTE    CC:  F/u for surgery  HPI:  This is a 66 y.o. male who has ESRD.     He has hx of left BC AVF created on 09/05/2021 by Dr. Carlis Abbott.  It was slow to mature.   He was taken for side branch ligation on 10/17/2021 by Dr. Stanford Breed.  He was seen back in the office and fistula still slow to mature and he underwent revision of left BC AVF with conversion to brachiocephalic AV Graft on 7/82/9562.  He comes in today for wound check and here with his wife.  His incisions have healed.   Pt states he does not have pain/numbness in the left hand.   He tells me his dialysis nurse has not been able to hear the graft since the day after the graft was placed.    The pt is on dialysis M/W/F at [place] location.  Pt has right IJ TDC placed 07/17/2021 by IR.    Allergies  Allergen Reactions   Norvasc [Amlodipine] Swelling and Other (See Comments)    Swelling of lower limbs    Current Outpatient Medications  Medication Sig Dispense Refill   atorvastatin (LIPITOR) 20 MG tablet Take 10 mg by mouth at bedtime.     B Complex-C-Folic Acid (DIALYVITE TABLET) TABS Take 1 tablet by mouth every evening.     carvedilol (COREG) 25 MG tablet Take 50 mg by mouth 2 (two) times daily with a meal.     Cholecalciferol 25 MCG (1000 UT) TBDP Take 1,000 Units by mouth in the morning.     diclofenac Sodium (VOLTAREN) 1 % GEL Apply 2 g topically 4 (four) times daily as needed (arthritis pain).     hydrALAZINE (APRESOLINE) 25 MG tablet Take 1 tablet (25 mg total) by mouth every 8 (eight) hours. 90 tablet 0   HYDROcodone-acetaminophen (NORCO/VICODIN) 5-325 MG tablet Take 1 tablet by mouth every 6 (six) hours as needed. (Patient taking differently: Take 1 tablet by mouth every 6 (six) hours as needed for moderate pain.) 8 tablet 0   lidocaine (XYLOCAINE) 5 % ointment Apply 1 Application topically as needed (back pain.).     lidocaine-prilocaine (EMLA) cream Apply 1 application  topically as needed  (for fistula).     losartan (COZAAR) 100 MG tablet Take 100 mg by mouth at bedtime.     multivitamin (RENA-VIT) TABS tablet Take 1 tablet by mouth at bedtime. 30 tablet 0   Nutritional Supplements (FEEDING SUPPLEMENT, NEPRO CARB STEADY,) LIQD Take 237 mLs by mouth daily.  0   oxyCODONE (OXY IR/ROXICODONE) 5 MG immediate release tablet Take 1 tablet (5 mg total) by mouth every 8 (eight) hours as needed for severe pain. 15 tablet 0   sevelamer carbonate (RENVELA) 800 MG tablet Take 1 tablet (800 mg total) by mouth 3 (three) times daily with meals. 90 tablet 1   No current facility-administered medications for this visit.     ROS:  See HPI  Physical Exam:  Today's Vitals   01/21/22 0930  BP: (!) 158/72  Pulse: 68  Resp: 20  Temp: 98.1 F (36.7 C)  TempSrc: Temporal  SpO2: 98%  Weight: 263 lb 8 oz (119.5 kg)  Height: 5' 6.5" (1.689 m)   Body mass index is 41.89 kg/m.   Incision:  well healed Extremities:   There is a palpable left radial pulse.   Motor and sensory are in tact.   There is not a thrill/bruit present.  The graft is occluded   Assessment/Plan:  This is a 66 y.o. male who is s/p: left BC AVF created on 09/05/2021 by Dr. Carlis Abbott.  It was slow to mature.   He was taken for side branch ligation on 10/17/2021 by Dr. Stanford Breed.  He was seen back in the office and fistula still slow to mature and he underwent revision of left BC AVF with conversion to brachiocephalic AVG on 3/53/2992.   -on exam today, the LUA AVG is occluded.  He states that the dialysis nurse has not been able to hear the graft since the day after surgery so sounds as if it has been occluded nearly a month.  Pt and wife frustrated about access.  Dr. Stanford Breed talked with pt and his wife and given this occluded shortly after placement, would not be beneficial to undergo thrombectomy bc most likely would quickly re-occlude.  He discussed that next option would be to proceed with access in the right arm.  On vein  mapping in February, his cephalic vein appeared suitable for fistula.  Discussed with he and his wife that if it did mature, it most likely would require superficialization but would cross that bridge at the time.  Dr. Stanford Breed gave pt and his wife option to get 2nd opinion if they would like but would like to proceed with Dr. Stanford Breed.  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  He does have appt with his nephrologist at the [place] on 8/17. I have asked them to discuss this with her as she would be the one to initiate this.  -discussed with pt and his wife that access does not last forever and will need intervention or even new access at some point.  -the pt will see his nephrologist on Thursday and plan for his wife to call [facility] to schedule right arm AV fistula vs AV graft on non dialysis day with Dr. Stanford Breed.  She was given card on how to get in touch with [facility].  They will call sooner if any issues.    Leontine Locket, [facility] [phone]  Clinic MD:  Stanford Breed

## 2022-01-21 ENCOUNTER — Ambulatory Visit (INDEPENDENT_AMBULATORY_CARE_PROVIDER_SITE_OTHER): Payer: No Typology Code available for payment source | Admitting: Physician Assistant

## 2022-01-21 ENCOUNTER — Encounter: Payer: Self-pay | Admitting: Physician Assistant

## 2022-01-21 VITALS — BP 158/72 | HR 68 | Temp 98.1°F | Resp 20 | Ht 66.5 in | Wt 263.5 lb

## 2022-01-21 DIAGNOSIS — G4730 Sleep apnea, unspecified: Secondary | ICD-10-CM | POA: Insufficient documentation

## 2022-01-21 DIAGNOSIS — R54 Age-related physical debility: Secondary | ICD-10-CM | POA: Insufficient documentation

## 2022-01-21 DIAGNOSIS — N471 Phimosis: Secondary | ICD-10-CM | POA: Insufficient documentation

## 2022-01-21 DIAGNOSIS — F4310 Post-traumatic stress disorder, unspecified: Secondary | ICD-10-CM | POA: Insufficient documentation

## 2022-01-21 DIAGNOSIS — N185 Chronic kidney disease, stage 5: Secondary | ICD-10-CM | POA: Insufficient documentation

## 2022-01-21 DIAGNOSIS — N529 Male erectile dysfunction, unspecified: Secondary | ICD-10-CM | POA: Insufficient documentation

## 2022-01-21 DIAGNOSIS — H903 Sensorineural hearing loss, bilateral: Secondary | ICD-10-CM | POA: Insufficient documentation

## 2022-01-21 DIAGNOSIS — T82848A Pain from vascular prosthetic devices, implants and grafts, initial encounter: Secondary | ICD-10-CM | POA: Insufficient documentation

## 2022-01-21 DIAGNOSIS — Z992 Dependence on renal dialysis: Secondary | ICD-10-CM

## 2022-01-21 DIAGNOSIS — F32A Depression, unspecified: Secondary | ICD-10-CM | POA: Insufficient documentation

## 2022-01-21 DIAGNOSIS — N186 End stage renal disease: Secondary | ICD-10-CM

## 2022-06-04 ENCOUNTER — Other Ambulatory Visit: Payer: Self-pay | Admitting: *Deleted

## 2022-06-04 DIAGNOSIS — M79604 Pain in right leg: Secondary | ICD-10-CM

## 2022-06-11 IMAGING — CR DG CHEST 2V
2 series · 2 of 2 positions shown · non-contrast
Comparison: None.

CLINICAL DATA: Fluid overload. 27 pound weight gain. Elevated
creatinine.

EXAM:
CHEST - 2 VIEW

[chest lat]
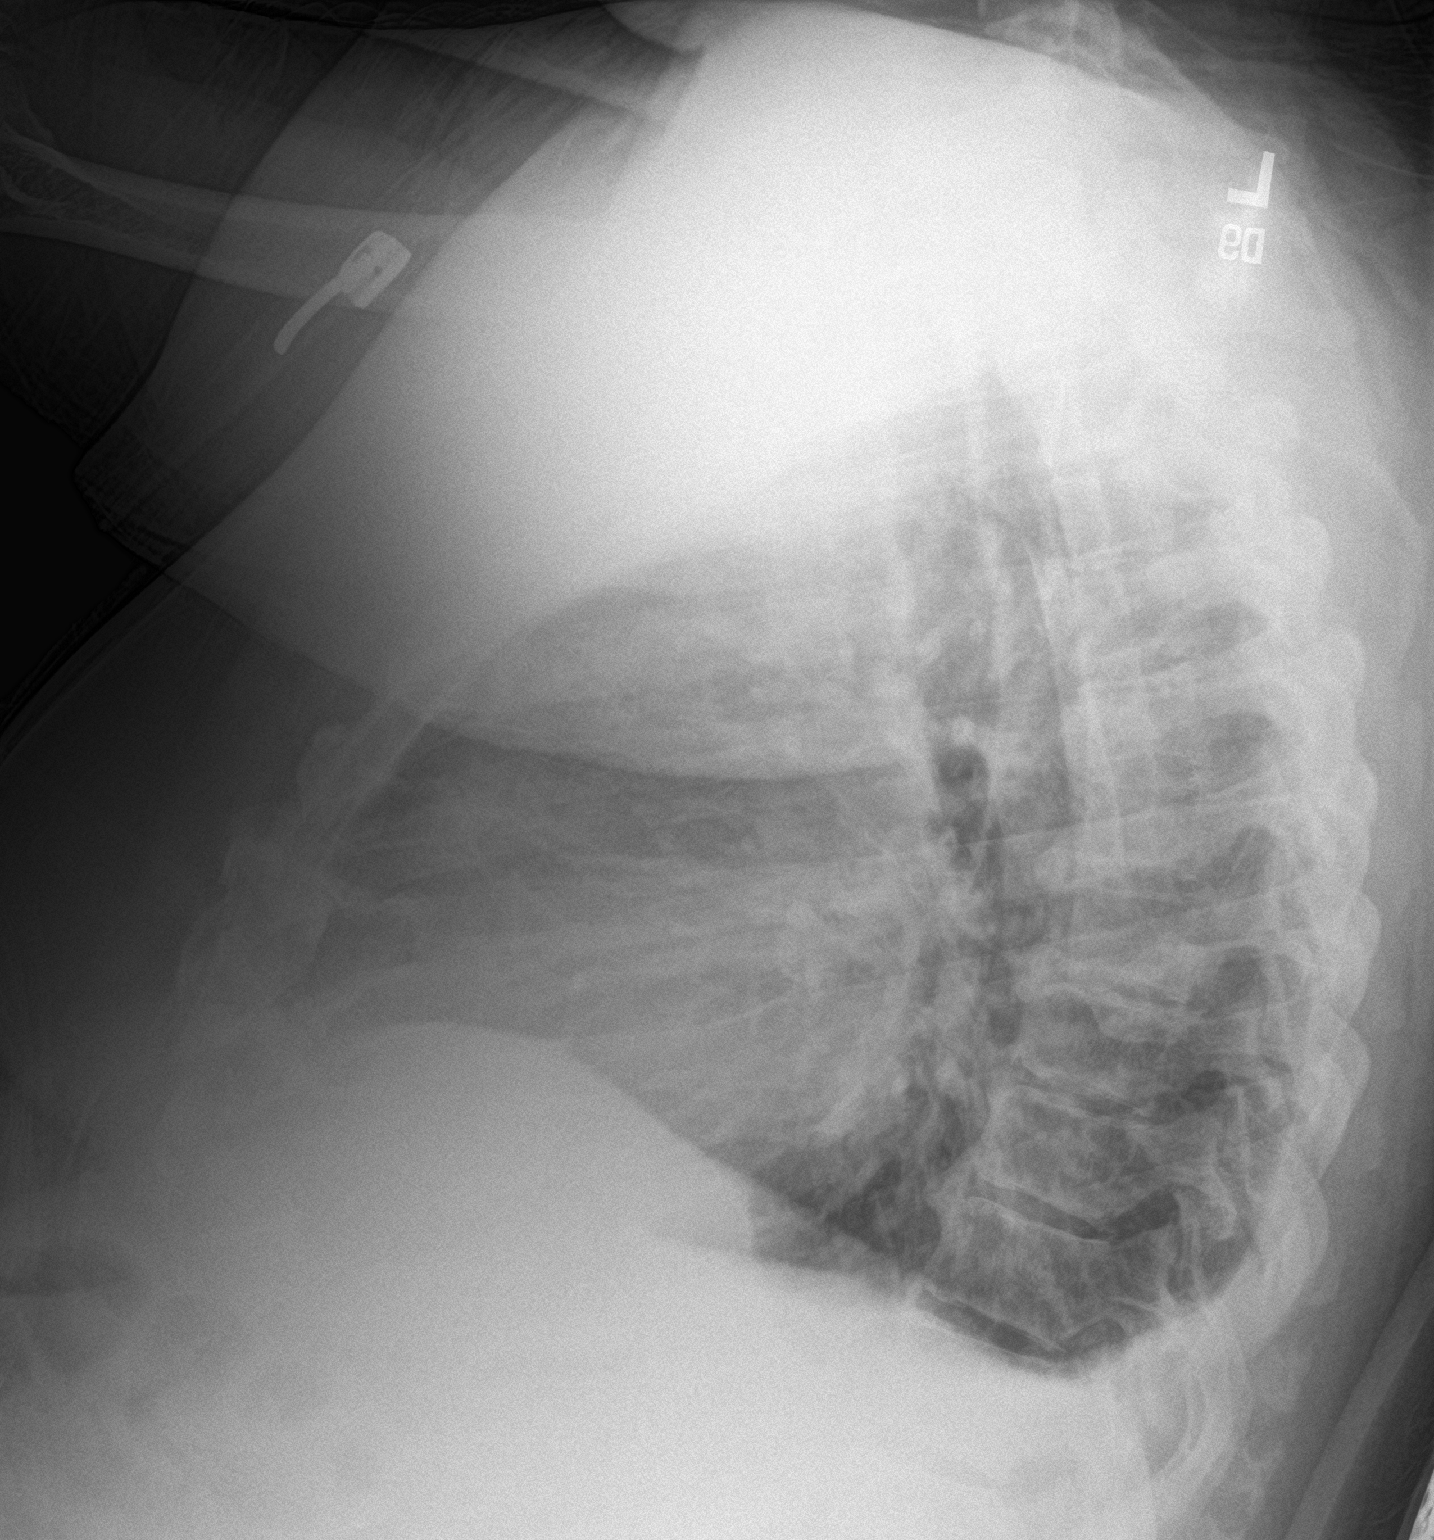

[chest ap]
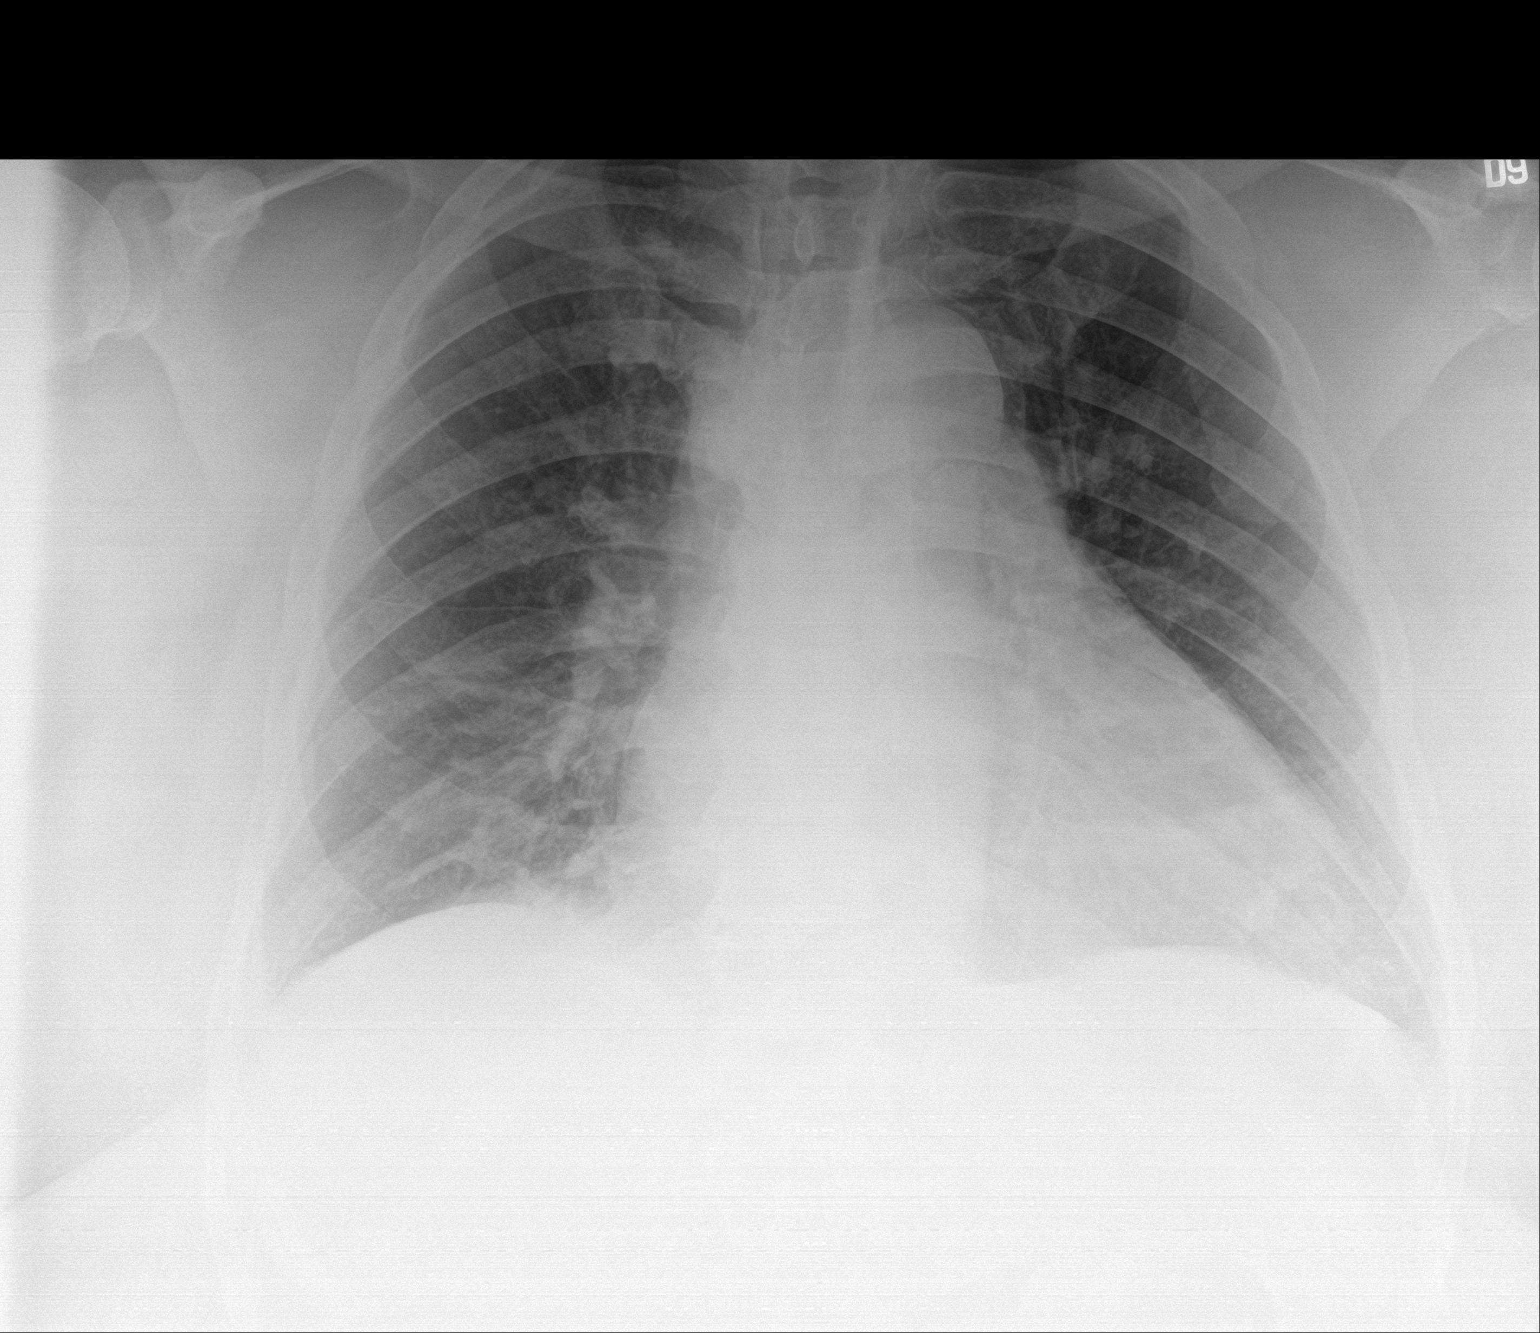

[2 of 2 positions shown; findings below may reference images not displayed]

FINDINGS: The heart is enlarged. Mild vascular congestion is present. Small
effusions are present. Ill-defined scratched at minimal airspace
opacity is present at both lung bases.
IMPRESSION: Cardiomegaly with mild vascular congestion and small bilateral
pleural effusions compatible with congestive heart failure.

## 2022-06-12 IMAGING — US IR FLUORO GUIDE CV LINE*R*
1 series · 1 of 1 positions shown · non-contrast
Comparison: none

INDICATION: 65-year-old with end-stage renal disease. Tunneled catheter needed
for hemodialysis.

[Series 1: ir (id) (id)/(id)/(id) ir · 1 of 1 slices shown]
[im 1/1]
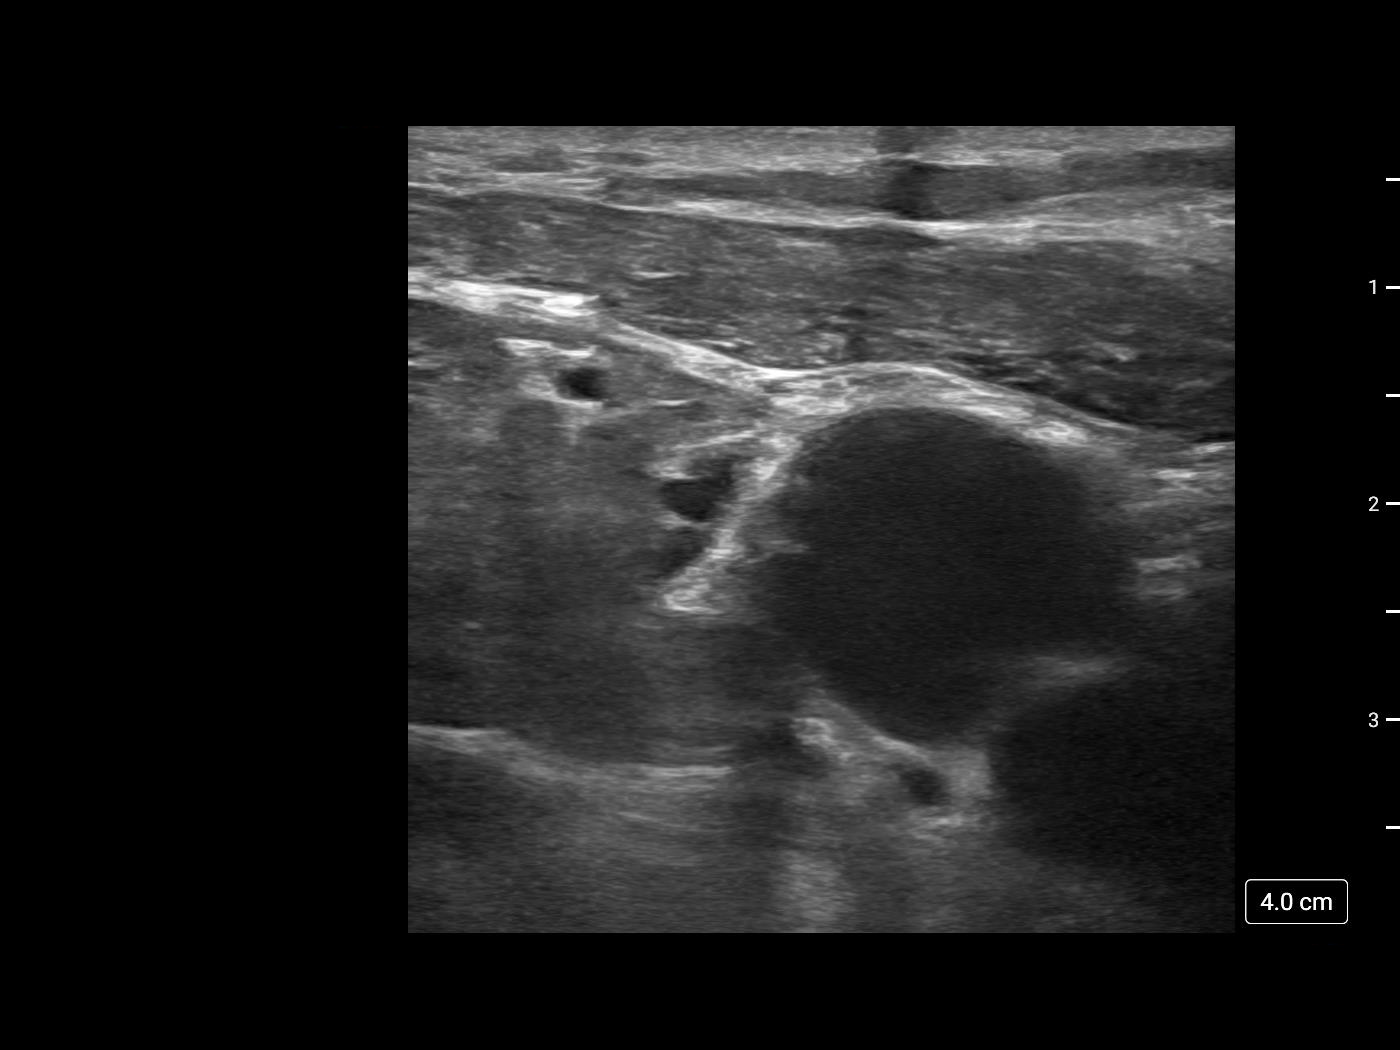

[1 of 1 positions shown; findings below may reference images not displayed]

EXAM:
FLUOROSCOPIC AND ULTRASOUND GUIDED PLACEMENT OF A TUNNELED DIALYSIS
CATHETER

MEDICATIONS:
Ancef 2 g; The antibiotic was administered within an appropriate
time interval prior to skin puncture.

ANESTHESIA/SEDATION:
Moderate (conscious) sedation was employed during this procedure. A
total of Versed 1.5mg and fentanyl 25 mcg was administered
intravenously at the order of the provider performing the procedure.

Total intra-service moderate sedation time: 26 minutes.

Patient's level of consciousness and vital signs were monitored
continuously by radiology nurse throughout the procedure under the
supervision of the provider performing the procedure.

FLUOROSCOPY TIME:  Radiation Exposure Index (as provided by the
fluoroscopic device): 13.4 mGy Kerma

COMPLICATIONS:
None immediate.

PROCEDURE:
The procedure was explained to the patient. The risks and benefits
of the procedure were discussed and the patient's questions were
addressed. Informed consent was obtained from the patient. The
patient was placed supine on the interventional table. Ultrasound
confirmed a patent right internal jugular vein. Ultrasound image
obtained for documentation. The right neck and chest was prepped and
draped in a sterile fashion. Maximal barrier sterile technique was
utilized including caps, mask, sterile gowns, sterile gloves,
sterile drape, hand hygiene and skin antiseptic. The right neck was
anesthetized with 1% lidocaine. A small incision was made with #11
blade scalpel. A 21 gauge needle directed into the right internal
jugular vein with ultrasound guidance. A micropuncture dilator set
was placed. A 23 cm tip to cuff Palindrome catheter was selected.
The skin below the right clavicle was anesthetized and a small
incision was made with an #11 blade scalpel. A subcutaneous tunnel
was formed to the vein dermatotomy site. The catheter was brought
through the tunnel. The vein dermatotomy site was dilated to
accommodate a peel-away sheath. The catheter was placed through the
peel-away sheath and directed into the central venous structures.
The tip of the catheter was placed in the upper right atrium with
fluoroscopy. Fluoroscopic images were obtained for documentation.
Both lumens were found to aspirate and flush well. The proper amount
of heparin was flushed in both lumens. The vein dermatotomy site was
closed using a single layer of absorbable suture and Dermabond.
Gel-Foam was placed in subcutaneous tract. The catheter was secured
to the skin using Prolene suture.
IMPRESSION: Successful placement of a right jugular tunneled dialysis catheter
using ultrasound and fluoroscopic guidance.

## 2022-06-19 ENCOUNTER — Ambulatory Visit: Payer: No Typology Code available for payment source

## 2022-06-19 ENCOUNTER — Encounter (HOSPITAL_COMMUNITY): Payer: No Typology Code available for payment source

## 2022-07-02 NOTE — Progress Notes (Deleted)
HISTORY AND PHYSICAL     CC:  follow up. Requesting Provider:  Elizbeth Squires, MD  HPI: This is a 67 y.o. male who is here today for follow up for PAD.  Pt is known to our practice for dialysis access.  He is here today for evaluation for ***  Access Hx: -Left RC AVF at antecubital space 09/05/2021 Dr. Stanford Breed -side branch ligation 10/17/2021 Dr. Stanford Breed -revision of left [facility] AVF with conversion to [facility] AVG 12/19/2021 Dr. Stanford Breed  The pt returns today for follow up.  ***  The pt is on a statin for cholesterol management.    The pt is not on an aspirin.    Other AC:  none The pt is on ARB, BB for hypertension.  The pt does not have diabetes. Tobacco hx:  never    Past Medical History:  Diagnosis Date   Anemia    Arthritis    Chronic kidney disease    Complication of anesthesia    Stopped breathing during rotator cuff surgery   Diabetes mellitus without complication (HCC)    Type II   ESRD (end stage renal disease) ([place])    MWF [place] started 07/2021   Hypertension    Sleep apnea    Does not use a Cpap    Past Surgical History:  Procedure Laterality Date   AV FISTULA PLACEMENT Left 09/05/2021   Procedure: LEFT ARM  RADIOCEPHALIC ARTERIOVENOUS (AV) FISTULA CREATION;  Surgeon: Cherre Robins, MD;  Location: [facility];  Service: Vascular;  Laterality: Left;  PERIPHERAL NERVE BLOCK   AV FISTULA PLACEMENT Left 12/19/2021   Procedure: INSERTION OF LEFT UPPER ARM ARTERIOVENOUS (AV) GORE-TEX GRAFT;  Surgeon: Cherre Robins, MD;  Location: [place];  Service: Vascular;  Laterality: Left;   FISTULA SUPERFICIALIZATION Left 12/19/2021   Procedure: LEFT ARM ARTERIOVENOUS FISTULA SUPERFICIALIZATION;  Surgeon: Cherre Robins, MD;  Location: [facility];  Service: Vascular;  Laterality: Left;   HERNIA REPAIR     IR FLUORO GUIDE CV LINE RIGHT  07/17/2021   IR US GUIDE VASC ACCESS RIGHT  07/17/2021   REVISON OF ARTERIOVENOUS FISTULA Left 10/17/2021   Procedure: LEFT ARM ARTERIOVENOUS FISTULA  LIGATION;  Surgeon: Cherre Robins, MD;  Location: [facility];  Service: Vascular;  Laterality: Left;  PERIPHERAL NERVE BLOCK   ROTATOR CUFF REPAIR Right     Allergies  Allergen Reactions   Norvasc [Amlodipine] Swelling and Other (See Comments)    Swelling of lower limbs    Current Outpatient Medications  Medication Sig Dispense Refill   atorvastatin (LIPITOR) 20 MG tablet Take 10 mg by mouth at bedtime.     B Complex-C-Folic Acid (DIALYVITE TABLET) TABS Take 1 tablet by mouth every evening.     carvedilol (COREG) 25 MG tablet Take 50 mg by mouth 2 (two) times daily with a meal.     Cholecalciferol 25 MCG (1000 UT) TBDP Take 1,000 Units by mouth in the morning.     diclofenac Sodium (VOLTAREN) 1 % GEL Apply 2 g topically 4 (four) times daily as needed (arthritis pain).     hydrALAZINE (APRESOLINE) 25 MG tablet Take 1 tablet (25 mg total) by mouth every 8 (eight) hours. (Patient taking differently: Take 50 mg by mouth every 8 (eight) hours.) 90 tablet 0   HYDROcodone-acetaminophen (NORCO/VICODIN) 5-325 MG tablet Take 1 tablet by mouth every 6 (six) hours as needed. (Patient taking differently: Take 1 tablet by mouth every 6 (six) hours as needed for moderate  pain.) 8 tablet 0   lidocaine (XYLOCAINE) 5 % ointment Apply 1 Application topically as needed (back pain.).     lidocaine-prilocaine (EMLA) cream Apply 1 application  topically as needed (for fistula).     losartan (COZAAR) 100 MG tablet Take 100 mg by mouth at bedtime.     multivitamin (RENA-VIT) TABS tablet Take 1 tablet by mouth at bedtime. 30 tablet 0   Nutritional Supplements (FEEDING SUPPLEMENT, NEPRO CARB STEADY,) LIQD Take 237 mLs by mouth daily.  0   oxyCODONE (OXY IR/ROXICODONE) 5 MG immediate release tablet Take 1 tablet (5 mg total) by mouth every 8 (eight) hours as needed for severe pain. 15 tablet 0   sevelamer carbonate (RENVELA) 800 MG tablet Take 1 tablet (800 mg total) by mouth 3 (three) times daily with meals. 90 tablet 1    No current facility-administered medications for this visit.    Family History  Problem Relation Age of Onset   CAD Mother    Hypertension Sister    Renal Disease Sister     Social History   Socioeconomic History   Marital status: Married    Spouse name: Not on file   Number of children: Not on file   Years of education: Not on file   Highest education level: Not on file  Occupational History   Not on file  Tobacco Use   Smoking status: Never    Passive exposure: Never   Smokeless tobacco: Never  Vaping Use   Vaping Use: Never used  Substance and Sexual Activity   Alcohol use: Never   Drug use: Never   Sexual activity: Not on file  Other Topics Concern   Not on file  Social History Narrative   Not on file   Social Determinants of Health   Financial Resource Strain: Not on file  Food Insecurity: Not on file  Transportation Needs: Not on file  Physical Activity: Not on file  Stress: Not on file  Social Connections: Not on file  Intimate Partner Violence: Not on file     REVIEW OF SYSTEMS:  *** '[X]'$  denotes positive finding, '[ ]'$  denotes negative finding Cardiac  Comments:  Chest pain or chest pressure:    Shortness of breath upon exertion:    Short of breath when lying flat:    Irregular heart rhythm:        Vascular    Pain in calf, thigh, or hip brought on by ambulation:    Pain in feet at night that wakes you up from your sleep:     Blood clot in your veins:    Leg swelling:         Pulmonary    Oxygen at home:    Productive cough:     Wheezing:         Neurologic    Sudden weakness in arms or legs:     Sudden numbness in arms or legs:     Sudden onset of difficulty speaking or slurred speech:    Temporary loss of vision in one eye:     Problems with dizziness:         Gastrointestinal    Blood in stool:     Vomited blood:         Genitourinary    Burning when urinating:     Blood in urine:        Psychiatric    Major depression:          Hematologic  Bleeding problems:    Problems with blood clotting too easily:        Skin    Rashes or ulcers:        Constitutional    Fever or chills:      PHYSICAL EXAMINATION:  ***  General:  WDWN in NAD; vital signs documented above Gait: Not observed HENT: WNL, normocephalic Pulmonary: normal non-labored breathing , without wheezing Cardiac: {Desc; regular/irreg:14544} HR, {With/Without:20273} carotid bruit*** Abdomen: soft, NT; aortic pulse is *** palpable Skin: {With/Without:20273} rashes Vascular Exam/Pulses:  Right Left  Radial {Exam; arterial pulse strength 0-4:30167} {Exam; arterial pulse strength 0-4:30167}  Femoral {Exam; arterial pulse strength 0-4:30167} {Exam; arterial pulse strength 0-4:30167}  Popliteal {Exam; arterial pulse strength 0-4:30167} {Exam; arterial pulse strength 0-4:30167}  DP {Exam; arterial pulse strength 0-4:30167} {Exam; arterial pulse strength 0-4:30167}  PT {Exam; arterial pulse strength 0-4:30167} {Exam; arterial pulse strength 0-4:30167}  Peroneal *** ***   Extremities: {With/Without:20273} ischemic changes, {With/Without:20273} Gangrene , {With/Without:20273} cellulitis; {With/Without:20273} open wounds Musculoskeletal: no muscle wasting or atrophy  Neurologic: A&O X 3 Psychiatric:  The pt has {Desc; normal/abnormal:11317::"Normal"} affect.   Non-Invasive Vascular Imaging:   ABI's/TBI's on 07/03/2022: Right:  *** - Great toe pressure: *** Left:  *** - Great toe pressure: ***    ASSESSMENT/PLAN:: 67 y.o. male here for follow up for PAD with hx of ***   -*** -continue *** -pt will f/u in *** with ***.   Leontine Locket, [facility] [phone]  Clinic MD:   Scot Dock

## 2022-07-03 ENCOUNTER — Ambulatory Visit: Payer: No Typology Code available for payment source

## 2022-07-03 ENCOUNTER — Ambulatory Visit (HOSPITAL_COMMUNITY): Payer: No Typology Code available for payment source

## 2022-07-16 NOTE — Progress Notes (Unsigned)
HISTORY AND PHYSICAL     CC:  follow up. Requesting Provider:  Elizbeth Squires, MD  HPI: This is a 67 y.o. male who is here today for follow up for PAD.    Pt has hx of ESRD with hx of  left BC AVF created on 09/05/2021 by Dr. Stanford Breed.  It was slow to mature.   He was taken for side branch ligation on 10/17/2021 by Dr. Stanford Breed.  He was seen back in the office and fistula still slow to mature and he underwent revision of left BC AVF with conversion to brachiocephalic AV Graft on 5/36/1443 with Dr. Stanford Breed.  His TDC was placed a year ago by IR.   The pt is on dialysis M/W/F at [place] location.  Pt has right IJ TDC placed 07/17/2021 by IR.    The pt returns today for follow up.  He states he gets pain in his legs for 24-36 hours after dialysis.  His wife states she has to rub his legs to help make them feel better.  Right is worse than left.  He states the pain radiates down his leg.  He tells me he has some degenerative disc disease in the lumbar and sacral spine.  He has had a couple of epidural spinal injections that gave him some relief for a couple of days.  He does not endorse rest pain, non healing wounds or claudication.    Pt is an Scientist, research (life sciences).    The pt is on a statin for cholesterol management.    The pt is not on an aspirin.    Other AC:  none The pt is on ARB, BB, hydralazine for hypertension.  The pt does not have diabetes. Tobacco hx:  never  Pt does not have family hx of AAA.  Past Medical History:  Diagnosis Date   Anemia    Arthritis    Chronic kidney disease    Complication of anesthesia    Stopped breathing during rotator cuff surgery   Diabetes mellitus without complication ([place])    Type II   ESRD (end stage renal disease) ([place])    MWF [place] started 07/2021   Hypertension    Sleep apnea    Does not use a Cpap    Past Surgical History:  Procedure Laterality Date   AV FISTULA PLACEMENT Left 09/05/2021   Procedure: LEFT ARM  RADIOCEPHALIC  ARTERIOVENOUS (AV) FISTULA CREATION;  Surgeon: Cherre Robins, MD;  Location: [place];  Service: Vascular;  Laterality: Left;  PERIPHERAL NERVE BLOCK   AV FISTULA PLACEMENT Left 12/19/2021   Procedure: INSERTION OF LEFT UPPER ARM ARTERIOVENOUS (AV) GORE-TEX GRAFT;  Surgeon: Cherre Robins, MD;  Location: [place];  Service: Vascular;  Laterality: Left;   FISTULA SUPERFICIALIZATION Left 12/19/2021   Procedure: LEFT ARM ARTERIOVENOUS FISTULA SUPERFICIALIZATION;  Surgeon: Cherre Robins, MD;  Location: [place];  Service: Vascular;  Laterality: Left;   HERNIA REPAIR     IR FLUORO GUIDE CV LINE RIGHT  07/17/2021   IR US GUIDE VASC ACCESS RIGHT  07/17/2021   REVISON OF ARTERIOVENOUS FISTULA Left 10/17/2021   Procedure: LEFT ARM ARTERIOVENOUS FISTULA LIGATION;  Surgeon: Cherre Robins, MD;  [facility];  Service: Vascular;  Laterality: Left;  PERIPHERAL NERVE BLOCK   ROTATOR CUFF REPAIR Right     Allergies  Allergen Reactions   Norvasc [Amlodipine] Swelling and Other (See Comments)    Swelling of lower limbs    Current Outpatient Medications  Medication Sig Dispense Refill   atorvastatin (LIPITOR) 20 MG tablet Take 10 mg by mouth at bedtime.     B Complex-C-Folic Acid (DIALYVITE TABLET) TABS Take 1 tablet by mouth every evening.     carvedilol (COREG) 25 MG tablet Take 50 mg by mouth 2 (two) times daily with a meal.     Cholecalciferol 25 MCG (1000 UT) TBDP Take 1,000 Units by mouth in the morning.     diclofenac Sodium (VOLTAREN) 1 % GEL Apply 2 g topically 4 (four) times daily as needed (arthritis pain).     hydrALAZINE (APRESOLINE) 25 MG tablet Take 1 tablet (25 mg total) by mouth every 8 (eight) hours. (Patient taking differently: Take 50 mg by mouth every 8 (eight) hours.) 90 tablet 0   HYDROcodone-acetaminophen (NORCO/VICODIN) 5-325 MG tablet Take 1 tablet by mouth every 6 (six) hours as needed. (Patient taking differently: Take 1 tablet by mouth every 6 (six) hours as needed for moderate  pain.) 8 tablet 0   lidocaine (XYLOCAINE) 5 % ointment Apply 1 Application topically as needed (back pain.).     lidocaine-prilocaine (EMLA) cream Apply 1 application  topically as needed (for fistula).     losartan (COZAAR) 100 MG tablet Take 100 mg by mouth at bedtime.     multivitamin (RENA-VIT) TABS tablet Take 1 tablet by mouth at bedtime. 30 tablet 0   Nutritional Supplements (FEEDING SUPPLEMENT, NEPRO CARB STEADY,) LIQD Take 237 mLs by mouth daily.  0   oxyCODONE (OXY IR/ROXICODONE) 5 MG immediate release tablet Take 1 tablet (5 mg total) by mouth every 8 (eight) hours as needed for severe pain. 15 tablet 0   sevelamer carbonate (RENVELA) 800 MG tablet Take 1 tablet (800 mg total) by mouth 3 (three) times daily with meals. 90 tablet 1   No current facility-administered medications for this visit.    Family History  Problem Relation Age of Onset   CAD Mother    Hypertension Sister    Renal Disease Sister     Social History   Socioeconomic History   Marital status: Married    Spouse name: Not on file   Number of children: Not on file   Years of education: Not on file   Highest education level: Not on file  Occupational History   Not on file  Tobacco Use   Smoking status: Never    Passive exposure: Never   Smokeless tobacco: Never  Vaping Use   Vaping Use: Never used  Substance and Sexual Activity   Alcohol use: Never   Drug use: Never   Sexual activity: Not on file  Other Topics Concern   Not on file  Social History Narrative   Not on file   Social Determinants of Health   Financial Resource Strain: Not on file  Food Insecurity: Not on file  Transportation Needs: Not on file  Physical Activity: Not on file  Stress: Not on file  Social Connections: Not on file  Intimate Partner Violence: Not on file     REVIEW OF SYSTEMS:   [X]  denotes positive finding, [ ]  denotes negative finding Cardiac  Comments:  Chest pain or chest pressure:    Shortness of  breath upon exertion:    Short of breath when lying flat:    Irregular heart rhythm:        Vascular    Pain in calf, thigh, or hip brought on by ambulation:    Pain in feet at night that wakes you  up from your sleep:  x   Blood clot in your veins:    Leg swelling:         Pulmonary    Oxygen at home:    Productive cough:     Wheezing:         Neurologic    Sudden weakness in arms or legs:     Sudden numbness in arms or legs:     Sudden onset of difficulty speaking or slurred speech:    Temporary loss of vision in one eye:     Problems with dizziness:         Gastrointestinal    Blood in stool:     Vomited blood:         Genitourinary    Burning when urinating:     Blood in urine:        Psychiatric    Major depression:         Hematologic    Bleeding problems:    Problems with blood clotting too easily:        Skin    Rashes or ulcers:        Constitutional    Fever or chills:      PHYSICAL EXAMINATION:  Today's Vitals   07/17/22 1443  BP: 136/78  Pulse: 78  Temp: 98.7 F (37.1 C)  TempSrc: Temporal  SpO2: 98%  PainSc: 6    There is no height or weight on file to calculate BMI.   General:  WDWN in NAD; vital signs documented above Gait: Not observed HENT: WNL, normocephalic Pulmonary: normal non-labored breathing , without wheezing Cardiac: regular HR, without carotid bruits Abdomen: soft, NT; aortic pulse is not palpable Skin: without rashes Vascular Exam/Pulses: Easily palpable bilateral radial and DP pulses.  Extremities: without ischemic changes, without Gangrene , without cellulitis; without open wounds Musculoskeletal: no muscle wasting or atrophy  Neurologic: A&O X 3 Psychiatric:  The pt has Normal affect.   Non-Invasive Vascular Imaging:   ABI's/TBI's on 07/17/2022: Right:  1.29/1.13 - Great toe pressure: 159 Left:  1.30/1.17 - Great toe pressure: 165     ASSESSMENT/PLAN:: 67 y.o. male here for right leg pain  He also has hx  of ESRD with hx of left BC AVF created on 09/05/2021 by Dr. Carlis Abbott.  It was slow to mature.   He was taken for side branch ligation on 10/17/2021 by Dr. Stanford Breed.  He was seen back in the office and fistula still slow to mature and he underwent revision of left BC AVF with conversion to brachiocephalic AV Graft on 02/07/5175.    -pt came in today for evaluation of right>left leg pain especially after HD.  His ABI are normal with easily palpable pedal pulses.  Given he has had some relief with epidural steroid injections, this is most likely due to his DDD.  Discussed that they would need referral to back/spine surgeon.    -as far as his HD access, they are in the process of going for 2nd opinion.  He will have his [facility] for one year tomorrow.   -pt will f/u with [place] as needed.  He and his wife know to call if he has any issues and we will be glad to see him.     Leontine Locket, [facility] [phone]  Clinic MD:   Virl Cagey on call MD

## 2022-07-17 ENCOUNTER — Ambulatory Visit (HOSPITAL_COMMUNITY)
Admission: RE | Admit: 2022-07-17 | Discharge: 2022-07-17 | Disposition: A | Discharge status: Home / Self Care | Payer: No Typology Code available for payment source | Source: Ambulatory Visit | Attending: Physician Assistant | Admitting: Physician Assistant

## 2022-07-17 ENCOUNTER — Ambulatory Visit: Payer: No Typology Code available for payment source | Admitting: Physician Assistant

## 2022-07-17 VITALS — BP 136/78 | HR 78 | Temp 98.7°F

## 2022-07-17 DIAGNOSIS — M79605 Pain in left leg: Secondary | ICD-10-CM | POA: Insufficient documentation

## 2022-07-17 DIAGNOSIS — M79604 Pain in right leg: Secondary | ICD-10-CM | POA: Insufficient documentation

## 2022-07-18 LAB — VAS US ABI WITH/WO TBI
Left ABI: 1.3
Right ABI: 1.29
